# Patient Record
Sex: Female | Born: 1990 | Race: White | Hispanic: No | Marital: Married | Smoking: Never smoker
Health system: Southern US, Community
[De-identification: ages and names within clinical notes are randomized; demographics above are authoritative.]

## PROBLEM LIST (undated history)

## (undated) HISTORY — PX: TONSILLECTOMY: SUR1361

## (undated) HISTORY — PX: KNEE SURGERY: SHX244

---

## 2016-07-27 DIAGNOSIS — H66009 Acute suppurative otitis media without spontaneous rupture of ear drum, unspecified ear: Secondary | ICD-10-CM | POA: Diagnosis not present

## 2016-11-12 DIAGNOSIS — J0100 Acute maxillary sinusitis, unspecified: Secondary | ICD-10-CM | POA: Diagnosis not present

## 2017-03-31 DIAGNOSIS — Z01419 Encounter for gynecological examination (general) (routine) without abnormal findings: Secondary | ICD-10-CM | POA: Diagnosis not present

## 2017-05-17 DIAGNOSIS — Z1389 Encounter for screening for other disorder: Secondary | ICD-10-CM | POA: Diagnosis not present

## 2017-05-17 DIAGNOSIS — Z6828 Body mass index (BMI) 28.0-28.9, adult: Secondary | ICD-10-CM | POA: Diagnosis not present

## 2017-05-17 DIAGNOSIS — R42 Dizziness and giddiness: Secondary | ICD-10-CM | POA: Diagnosis not present

## 2017-05-17 DIAGNOSIS — Z789 Other specified health status: Secondary | ICD-10-CM | POA: Diagnosis not present

## 2017-08-30 DIAGNOSIS — N911 Secondary amenorrhea: Secondary | ICD-10-CM | POA: Diagnosis not present

## 2017-09-08 DIAGNOSIS — N911 Secondary amenorrhea: Secondary | ICD-10-CM | POA: Diagnosis not present

## 2017-09-15 DIAGNOSIS — Z3685 Encounter for antenatal screening for Streptococcus B: Secondary | ICD-10-CM | POA: Diagnosis not present

## 2017-09-15 DIAGNOSIS — Z3401 Encounter for supervision of normal first pregnancy, first trimester: Secondary | ICD-10-CM | POA: Diagnosis not present

## 2017-09-15 DIAGNOSIS — Z3480 Encounter for supervision of other normal pregnancy, unspecified trimester: Secondary | ICD-10-CM | POA: Diagnosis not present

## 2017-09-15 LAB — OB RESULTS CONSOLE ABO/RH: RH TYPE: POSITIVE

## 2017-09-15 LAB — OB RESULTS CONSOLE RUBELLA ANTIBODY, IGM: Rubella: IMMUNE

## 2017-09-15 LAB — OB RESULTS CONSOLE HEPATITIS B SURFACE ANTIGEN: Hepatitis B Surface Ag: NEGATIVE

## 2017-09-15 LAB — OB RESULTS CONSOLE ANTIBODY SCREEN: Antibody Screen: NEGATIVE

## 2017-09-15 LAB — OB RESULTS CONSOLE RPR: RPR: NONREACTIVE

## 2017-09-15 LAB — OB RESULTS CONSOLE HIV ANTIBODY (ROUTINE TESTING): HIV: NONREACTIVE

## 2017-10-06 DIAGNOSIS — Z3480 Encounter for supervision of other normal pregnancy, unspecified trimester: Secondary | ICD-10-CM | POA: Diagnosis not present

## 2017-10-06 DIAGNOSIS — Z113 Encounter for screening for infections with a predominantly sexual mode of transmission: Secondary | ICD-10-CM | POA: Diagnosis not present

## 2017-10-06 DIAGNOSIS — Z3A12 12 weeks gestation of pregnancy: Secondary | ICD-10-CM | POA: Diagnosis not present

## 2017-10-06 DIAGNOSIS — Z3400 Encounter for supervision of normal first pregnancy, unspecified trimester: Secondary | ICD-10-CM | POA: Diagnosis not present

## 2017-10-06 LAB — OB RESULTS CONSOLE GC/CHLAMYDIA: GC PROBE AMP, GENITAL: NEGATIVE

## 2017-10-13 DIAGNOSIS — Z3682 Encounter for antenatal screening for nuchal translucency: Secondary | ICD-10-CM | POA: Diagnosis not present

## 2017-10-13 DIAGNOSIS — Z13228 Encounter for screening for other metabolic disorders: Secondary | ICD-10-CM | POA: Diagnosis not present

## 2017-10-13 DIAGNOSIS — Z36 Encounter for antenatal screening for chromosomal anomalies: Secondary | ICD-10-CM | POA: Diagnosis not present

## 2017-10-13 DIAGNOSIS — Z3491 Encounter for supervision of normal pregnancy, unspecified, first trimester: Secondary | ICD-10-CM | POA: Diagnosis not present

## 2017-10-13 DIAGNOSIS — Z3480 Encounter for supervision of other normal pregnancy, unspecified trimester: Secondary | ICD-10-CM | POA: Diagnosis not present

## 2017-11-24 DIAGNOSIS — Z363 Encounter for antenatal screening for malformations: Secondary | ICD-10-CM | POA: Diagnosis not present

## 2017-12-22 DIAGNOSIS — Z362 Encounter for other antenatal screening follow-up: Secondary | ICD-10-CM | POA: Diagnosis not present

## 2017-12-22 DIAGNOSIS — Z3A22 22 weeks gestation of pregnancy: Secondary | ICD-10-CM | POA: Diagnosis not present

## 2017-12-27 DIAGNOSIS — J00 Acute nasopharyngitis [common cold]: Secondary | ICD-10-CM | POA: Diagnosis not present

## 2017-12-27 DIAGNOSIS — R0981 Nasal congestion: Secondary | ICD-10-CM | POA: Diagnosis not present

## 2018-01-17 DIAGNOSIS — J0100 Acute maxillary sinusitis, unspecified: Secondary | ICD-10-CM | POA: Diagnosis not present

## 2018-01-24 DIAGNOSIS — Z3480 Encounter for supervision of other normal pregnancy, unspecified trimester: Secondary | ICD-10-CM | POA: Diagnosis not present

## 2018-01-24 DIAGNOSIS — Z23 Encounter for immunization: Secondary | ICD-10-CM | POA: Diagnosis not present

## 2018-02-10 DIAGNOSIS — J101 Influenza due to other identified influenza virus with other respiratory manifestations: Secondary | ICD-10-CM | POA: Diagnosis not present

## 2018-02-10 DIAGNOSIS — Z1339 Encounter for screening examination for other mental health and behavioral disorders: Secondary | ICD-10-CM | POA: Diagnosis not present

## 2018-02-10 DIAGNOSIS — Z1331 Encounter for screening for depression: Secondary | ICD-10-CM | POA: Diagnosis not present

## 2018-02-10 DIAGNOSIS — R6889 Other general symptoms and signs: Secondary | ICD-10-CM | POA: Diagnosis not present

## 2018-03-22 DIAGNOSIS — Z3A35 35 weeks gestation of pregnancy: Secondary | ICD-10-CM | POA: Diagnosis not present

## 2018-03-22 DIAGNOSIS — Z3685 Encounter for antenatal screening for Streptococcus B: Secondary | ICD-10-CM | POA: Diagnosis not present

## 2018-03-22 DIAGNOSIS — Z3480 Encounter for supervision of other normal pregnancy, unspecified trimester: Secondary | ICD-10-CM | POA: Diagnosis not present

## 2018-04-01 LAB — OB RESULTS CONSOLE GBS: GBS: NEGATIVE

## 2018-04-20 ENCOUNTER — Inpatient Hospital Stay (HOSPITAL_COMMUNITY): Payer: BLUE CROSS/BLUE SHIELD | Admitting: Anesthesiology

## 2018-04-20 ENCOUNTER — Encounter (HOSPITAL_COMMUNITY): Payer: Self-pay | Admitting: *Deleted

## 2018-04-20 ENCOUNTER — Inpatient Hospital Stay (HOSPITAL_COMMUNITY)
Admission: AD | Admit: 2018-04-20 | Discharge: 2018-04-24 | DRG: 787 | Disposition: A | Discharge status: Home / Self Care | Payer: BLUE CROSS/BLUE SHIELD | Source: Ambulatory Visit | Attending: Obstetrics and Gynecology | Admitting: Obstetrics and Gynecology

## 2018-04-20 DIAGNOSIS — O324XX0 Maternal care for high head at term, not applicable or unspecified: Secondary | ICD-10-CM | POA: Diagnosis not present

## 2018-04-20 DIAGNOSIS — D62 Acute posthemorrhagic anemia: Secondary | ICD-10-CM | POA: Diagnosis not present

## 2018-04-20 DIAGNOSIS — Z3A00 Weeks of gestation of pregnancy not specified: Secondary | ICD-10-CM | POA: Diagnosis not present

## 2018-04-20 DIAGNOSIS — O9081 Anemia of the puerperium: Secondary | ICD-10-CM | POA: Diagnosis not present

## 2018-04-20 DIAGNOSIS — O4292 Full-term premature rupture of membranes, unspecified as to length of time between rupture and onset of labor: Secondary | ICD-10-CM | POA: Diagnosis present

## 2018-04-20 DIAGNOSIS — Z3A39 39 weeks gestation of pregnancy: Secondary | ICD-10-CM | POA: Diagnosis not present

## 2018-04-20 LAB — CBC
HCT: 35.3 % — ABNORMAL LOW (ref 36.0–46.0)
Hemoglobin: 12.1 g/dL (ref 12.0–15.0)
MCH: 29.6 pg (ref 26.0–34.0)
MCHC: 34.3 g/dL (ref 30.0–36.0)
MCV: 86.3 fL (ref 78.0–100.0)
Platelets: 282 10*3/uL (ref 150–400)
RBC: 4.09 MIL/uL (ref 3.87–5.11)
RDW: 12.9 % (ref 11.5–15.5)
WBC: 11.5 10*3/uL — AB (ref 4.0–10.5)

## 2018-04-20 LAB — ABO/RH: ABO/RH(D): O POS

## 2018-04-20 LAB — TYPE AND SCREEN
ABO/RH(D): O POS
ANTIBODY SCREEN: NEGATIVE

## 2018-04-20 LAB — POCT FERN TEST: POCT Fern Test: POSITIVE

## 2018-04-20 LAB — RPR: RPR: NONREACTIVE

## 2018-04-20 MED ORDER — EPHEDRINE 5 MG/ML INJ: 10.0000 mg | INTRAVENOUS | Status: DC | PRN

## 2018-04-20 MED ORDER — LACTATED RINGERS IV SOLN: 500.0000 mL | INTRAVENOUS | Status: DC | PRN

## 2018-04-20 MED ORDER — LACTATED RINGERS IV SOLN
500.0000 mL | Freq: Once | INTRAVENOUS | Status: AC
  Administered 2018-04-20: 500 mL via INTRAVENOUS

## 2018-04-20 MED ORDER — FLEET ENEMA 7-19 GM/118ML RE ENEM: 1.0000 | ENEMA | RECTAL | Status: DC | PRN

## 2018-04-20 MED ORDER — LACTATED RINGERS IV SOLN
INTRAVENOUS | Status: DC
  Administered 2018-04-20 (×2): via INTRAVENOUS

## 2018-04-20 MED ORDER — TERBUTALINE SULFATE 1 MG/ML IJ SOLN: 0.2500 mg | Freq: Once | INTRAMUSCULAR | Status: DC | PRN

## 2018-04-20 MED ORDER — OXYTOCIN 40 UNITS IN LACTATED RINGERS INFUSION - SIMPLE MED
1.0000 m[IU]/min | INTRAVENOUS | Status: DC
  Administered 2018-04-20: 2 m[IU]/min via INTRAVENOUS
  Filled 2018-04-20: qty 1000

## 2018-04-20 MED ORDER — OXYCODONE-ACETAMINOPHEN 5-325 MG PO TABS: 1.0000 | ORAL_TABLET | ORAL | Status: DC | PRN

## 2018-04-20 MED ORDER — FENTANYL 2.5 MCG/ML BUPIVACAINE 1/10 % EPIDURAL INFUSION (WH - ANES): 14.0000 mL/h | INTRAMUSCULAR | Status: DC | PRN

## 2018-04-20 MED ORDER — OXYTOCIN 40 UNITS IN LACTATED RINGERS INFUSION - SIMPLE MED: 2.5000 [IU]/h | INTRAVENOUS | Status: DC

## 2018-04-20 MED ORDER — LIDOCAINE HCL (PF) 1 % IJ SOLN
INTRAMUSCULAR | Status: DC | PRN
  Administered 2018-04-20 (×2): 4 mL via EPIDURAL

## 2018-04-20 MED ORDER — PHENYLEPHRINE 40 MCG/ML (10ML) SYRINGE FOR IV PUSH (FOR BLOOD PRESSURE SUPPORT)
80.0000 ug | PREFILLED_SYRINGE | INTRAVENOUS | Status: DC | PRN
  Filled 2018-04-20: qty 10

## 2018-04-20 MED ORDER — BUTORPHANOL TARTRATE 1 MG/ML IJ SOLN
1.0000 mg | Freq: Once | INTRAMUSCULAR | Status: AC
  Administered 2018-04-20: 1 mg via INTRAVENOUS
  Filled 2018-04-20: qty 1

## 2018-04-20 MED ORDER — OXYCODONE-ACETAMINOPHEN 5-325 MG PO TABS: 2.0000 | ORAL_TABLET | ORAL | Status: DC | PRN

## 2018-04-20 MED ORDER — OXYTOCIN BOLUS FROM INFUSION: 500.0000 mL | Freq: Once | INTRAVENOUS | Status: DC

## 2018-04-20 MED ORDER — FENTANYL 2.5 MCG/ML BUPIVACAINE 1/10 % EPIDURAL INFUSION (WH - ANES)
14.0000 mL/h | INTRAMUSCULAR | Status: DC | PRN
  Administered 2018-04-20 (×2): 14 mL/h via EPIDURAL
  Filled 2018-04-20 (×2): qty 100

## 2018-04-20 MED ORDER — LIDOCAINE HCL (PF) 1 % IJ SOLN
30.0000 mL | INTRAMUSCULAR | Status: DC | PRN
  Filled 2018-04-20: qty 30

## 2018-04-20 MED ORDER — SOD CITRATE-CITRIC ACID 500-334 MG/5ML PO SOLN
30.0000 mL | ORAL | Status: DC | PRN
  Administered 2018-04-20 – 2018-04-21 (×2): 30 mL via ORAL
  Filled 2018-04-20 (×2): qty 15

## 2018-04-20 MED ORDER — DIPHENHYDRAMINE HCL 50 MG/ML IJ SOLN: 12.5000 mg | INTRAMUSCULAR | Status: DC | PRN

## 2018-04-20 MED ORDER — PHENYLEPHRINE 40 MCG/ML (10ML) SYRINGE FOR IV PUSH (FOR BLOOD PRESSURE SUPPORT): 80.0000 ug | PREFILLED_SYRINGE | INTRAVENOUS | Status: DC | PRN

## 2018-04-20 MED ORDER — ACETAMINOPHEN 325 MG PO TABS: 650.0000 mg | ORAL_TABLET | ORAL | Status: DC | PRN

## 2018-04-20 MED ORDER — ONDANSETRON HCL 4 MG/2ML IJ SOLN: 4.0000 mg | Freq: Four times a day (QID) | INTRAMUSCULAR | Status: DC | PRN

## 2018-04-20 NOTE — Anesthesia Procedure Notes (Signed)
Epidural Patient location during procedure: OB Start time: 04/20/2018 1:00 PM End time: 04/20/2018 1:08 PM  Staffing Anesthesiologist: Lewie Loron, MD Performed: anesthesiologist   Preanesthetic Checklist Completed: patient identified, pre-op evaluation, timeout performed, IV checked, risks and benefits discussed and monitors and equipment checked  Epidural Patient position: sitting Prep: site prepped and draped and DuraPrep Patient monitoring: heart rate, continuous pulse ox and blood pressure Approach: midline Location: L3-L4 Injection technique: LOR air and LOR saline  Needle:  Needle type: Tuohy  Needle gauge: 17 G Needle length: 9 cm Needle insertion depth: 7 cm Catheter type: closed end flexible Catheter size: 19 Gauge Catheter at skin depth: 12 cm Test dose: negative  Assessment Sensory level: T8 Events: blood not aspirated, injection not painful, no injection resistance, negative IV test and no paresthesia  Additional Notes Reason for block:procedure for pain

## 2018-04-20 NOTE — Anesthesia Preprocedure Evaluation (Addendum)
Anesthesia Evaluation  Patient identified by MRN, date of birth, ID band Patient awake    Reviewed: Allergy & Precautions, NPO status , Patient's Chart, lab work & pertinent test results  Airway Mallampati: II  TM Distance: >3 FB Neck ROM: Full    Dental no notable dental hx.    Pulmonary neg pulmonary ROS,    Pulmonary exam normal breath sounds clear to auscultation       Cardiovascular negative cardio ROS Normal cardiovascular exam Rhythm:Regular Rate:Normal     Neuro/Psych negative neurological ROS  negative psych ROS   GI/Hepatic negative GI ROS, Neg liver ROS,   Endo/Other  negative endocrine ROS  Renal/GU negative Renal ROS     Musculoskeletal negative musculoskeletal ROS (+)   Abdominal   Peds  Hematology negative hematology ROS (+)   Anesthesia Other Findings   Reproductive/Obstetrics (+) Pregnancy                             Anesthesia Physical Anesthesia Plan  ASA: II and emergent  Anesthesia Plan: Epidural   Post-op Pain Management:    Induction: Intravenous  PONV Risk Score and Plan: Ondansetron, Dexamethasone, Treatment may vary due to age or medical condition and Scopolamine patch - Pre-op  Airway Management Planned: Natural Airway  Additional Equipment:   Intra-op Plan:   Post-operative Plan:   Informed Consent: I have reviewed the patients History and Physical, chart, labs and discussed the procedure including the risks, benefits and alternatives for the proposed anesthesia with the patient or authorized representative who has indicated his/her understanding and acceptance.   Dental advisory given  Plan Discussed with: CRNA, Anesthesiologist and Surgeon  Anesthesia Plan Comments: (C/Section for arrest of descent. Will use epidural. M. Malen Gauze, MD)       Anesthesia Quick Evaluation

## 2018-04-20 NOTE — H&P (Signed)
Meghan Mills is a 27 y.o. female presenting for SROM. OB History    Gravida  1   Para      Term      Preterm      AB      Living        SAB      TAB      Ectopic      Multiple      Live Births             History reviewed. No pertinent past medical history. History reviewed. No pertinent surgical history. Family History: family history is not on file. Social History:  reports that she has never smoked. She has never used smokeless tobacco. Her alcohol and drug histories are not on file.     Maternal Diabetes: No Genetic Screening: Normal Maternal Ultrasounds/Referrals: Normal Fetal Ultrasounds or other Referrals:  None Maternal Substance Abuse:  No Significant Maternal Medications:  None Significant Maternal Lab Results:  None Other Comments:  None  ROS Maternal Medical History:  Reason for admission: Rupture of membranes.   Contractions: Perceived severity is mild.    Fetal activity: Perceived fetal activity is normal.      Dilation: 2.5 Effacement (%): 70 Station: -1 Exam by:: Lauren Fields rn  Blood pressure 136/86, pulse 68, height  (1.753 m), weight 220 lb (99.8 kg). Exam Physical Exam  Constitutional: She appears well-developed and well-nourished.  HENT:  Head: Normocephalic and atraumatic.  Neck: Normal range of motion. Neck supple.  Cardiovascular: Normal rate and regular rhythm.  Respiratory: Effort normal and breath sounds normal.  GI:  Term FH, FHR 142  Genitourinary:  Genitourinary Comments: 2/50/vtx/clr af  Musculoskeletal: Normal range of motion.  Neurological: She is alert.    Prenatal labs: ABO, Rh:   Antibody:   Rubella:   RPR:    HBsAg:    HIV:    GBS:     Assessment/Plan: Term IUP/lSROM w/ early labor, GBS NEG   Meriel Pica 04/20/2018, 7:54 AM

## 2018-04-20 NOTE — Anesthesia Pain Management Evaluation Note (Signed)
  CRNA Pain Management Visit Note  Patient: Meghan Mills, 27 y.o., female  "Hello I am a member of the anesthesia team at [facility]. We have an anesthesia team available at all times to provide care throughout the hospital, including epidural management and anesthesia for C-section. I don't know your plan for the delivery whether it a natural birth, water birth, IV sedation, nitrous supplementation, doula or epidural, but we want to meet your pain goals."   1.Was your pain managed to your expectations on prior hospitalizations?   Yes   2.What is your expectation for pain management during this hospitalization?     Epidural  3.How can we help you reach that goal? Epidural as needed  Record the patient's initial score and the patient's pain goal.   Pain: 2  Pain Goal: 6 [facility] wants you to be able to say your pain was always managed very well.  Cleda Clarks 04/20/2018

## 2018-04-20 NOTE — MAU Note (Signed)
Pt presents to MAU c/o SROM at 0300. +FM. No bleeding.

## 2018-04-21 ENCOUNTER — Encounter (HOSPITAL_COMMUNITY): Payer: Self-pay

## 2018-04-21 ENCOUNTER — Encounter (HOSPITAL_COMMUNITY)
Admission: AD | Disposition: A | Discharge status: Home / Self Care | Payer: Self-pay | Source: Ambulatory Visit | Attending: Obstetrics and Gynecology

## 2018-04-21 LAB — GLUCOSE, CAPILLARY: Glucose-Capillary: 100 mg/dL — ABNORMAL HIGH (ref 65–99)

## 2018-04-21 SURGERY — Surgical Case
Anesthesia: Epidural

## 2018-04-21 MED ORDER — ONDANSETRON HCL 4 MG/2ML IJ SOLN
INTRAMUSCULAR | Status: AC
  Filled 2018-04-21: qty 4

## 2018-04-21 MED ORDER — OXYTOCIN 10 UNIT/ML IJ SOLN
INTRAMUSCULAR | Status: AC
  Filled 2018-04-21: qty 4

## 2018-04-21 MED ORDER — KETOROLAC TROMETHAMINE 30 MG/ML IJ SOLN
30.0000 mg | Freq: Four times a day (QID) | INTRAMUSCULAR | Status: AC | PRN
  Administered 2018-04-21: 30 mg via INTRAVENOUS
  Filled 2018-04-21: qty 1

## 2018-04-21 MED ORDER — PRENATAL MULTIVITAMIN CH
1.0000 | ORAL_TABLET | Freq: Every day | ORAL | Status: DC
  Administered 2018-04-21 – 2018-04-24 (×4): 1 via ORAL
  Filled 2018-04-21 (×4): qty 1

## 2018-04-21 MED ORDER — LACTATED RINGERS IV SOLN
INTRAVENOUS | Status: DC | PRN
  Administered 2018-04-21: 01:00:00 via INTRAVENOUS

## 2018-04-21 MED ORDER — COCONUT OIL OIL: 1.0000 "application " | TOPICAL_OIL | Status: DC | PRN

## 2018-04-21 MED ORDER — OXYCODONE-ACETAMINOPHEN 5-325 MG PO TABS
1.0000 | ORAL_TABLET | ORAL | Status: DC | PRN
  Administered 2018-04-22 (×2): 1 via ORAL
  Filled 2018-04-21 (×2): qty 1

## 2018-04-21 MED ORDER — DEXTROSE IN LACTATED RINGERS 5 % IV SOLN
INTRAVENOUS | Status: DC
  Administered 2018-04-21: 14:00:00 via INTRAVENOUS

## 2018-04-21 MED ORDER — ACETAMINOPHEN 325 MG PO TABS
650.0000 mg | ORAL_TABLET | ORAL | Status: DC | PRN
  Administered 2018-04-22 – 2018-04-24 (×5): 650 mg via ORAL
  Filled 2018-04-21 (×5): qty 2

## 2018-04-21 MED ORDER — NALBUPHINE HCL 10 MG/ML IJ SOLN: 5.0000 mg | INTRAMUSCULAR | Status: DC | PRN

## 2018-04-21 MED ORDER — NALOXONE HCL 0.4 MG/ML IJ SOLN: 0.4000 mg | INTRAMUSCULAR | Status: DC | PRN

## 2018-04-21 MED ORDER — KETOROLAC TROMETHAMINE 30 MG/ML IJ SOLN: 30.0000 mg | Freq: Four times a day (QID) | INTRAMUSCULAR | Status: AC | PRN

## 2018-04-21 MED ORDER — LIDOCAINE-EPINEPHRINE (PF) 2 %-1:200000 IJ SOLN
INTRAMUSCULAR | Status: AC
  Filled 2018-04-21: qty 20

## 2018-04-21 MED ORDER — SIMETHICONE 80 MG PO CHEW
80.0000 mg | CHEWABLE_TABLET | ORAL | Status: DC
  Administered 2018-04-22 – 2018-04-23 (×3): 80 mg via ORAL
  Filled 2018-04-21 (×2): qty 1

## 2018-04-21 MED ORDER — NALBUPHINE HCL 10 MG/ML IJ SOLN: 5.0000 mg | Freq: Once | INTRAMUSCULAR | Status: DC | PRN

## 2018-04-21 MED ORDER — DIPHENHYDRAMINE HCL 25 MG PO CAPS
25.0000 mg | ORAL_CAPSULE | Freq: Four times a day (QID) | ORAL | Status: DC | PRN
  Administered 2018-04-21: 25 mg via ORAL
  Filled 2018-04-21: qty 1

## 2018-04-21 MED ORDER — DIBUCAINE 1 % RE OINT: 1.0000 "application " | TOPICAL_OINTMENT | RECTAL | Status: DC | PRN

## 2018-04-21 MED ORDER — FLEET ENEMA 7-19 GM/118ML RE ENEM: 1.0000 | ENEMA | Freq: Every day | RECTAL | Status: DC | PRN

## 2018-04-21 MED ORDER — MENTHOL 3 MG MT LOZG: 1.0000 | LOZENGE | OROMUCOSAL | Status: DC | PRN

## 2018-04-21 MED ORDER — DEXAMETHASONE SODIUM PHOSPHATE 4 MG/ML IJ SOLN
INTRAMUSCULAR | Status: DC | PRN
  Administered 2018-04-21: 4 mg via INTRAVENOUS

## 2018-04-21 MED ORDER — NALOXONE HCL 4 MG/10ML IJ SOLN
1.0000 ug/kg/h | INTRAVENOUS | Status: DC | PRN
  Filled 2018-04-21: qty 5

## 2018-04-21 MED ORDER — ZOLPIDEM TARTRATE 5 MG PO TABS: 5.0000 mg | ORAL_TABLET | Freq: Every evening | ORAL | Status: DC | PRN

## 2018-04-21 MED ORDER — WITCH HAZEL-GLYCERIN EX PADS: 1.0000 "application " | MEDICATED_PAD | CUTANEOUS | Status: DC | PRN

## 2018-04-21 MED ORDER — FENTANYL CITRATE (PF) 100 MCG/2ML IJ SOLN: 25.0000 ug | INTRAMUSCULAR | Status: DC | PRN

## 2018-04-21 MED ORDER — OXYCODONE-ACETAMINOPHEN 5-325 MG PO TABS: 2.0000 | ORAL_TABLET | ORAL | Status: DC | PRN

## 2018-04-21 MED ORDER — DIPHENHYDRAMINE HCL 25 MG PO CAPS: 25.0000 mg | ORAL_CAPSULE | ORAL | Status: DC | PRN

## 2018-04-21 MED ORDER — SIMETHICONE 80 MG PO CHEW: 80.0000 mg | CHEWABLE_TABLET | ORAL | Status: DC | PRN

## 2018-04-21 MED ORDER — BISACODYL 10 MG RE SUPP: 10.0000 mg | Freq: Every day | RECTAL | Status: DC | PRN

## 2018-04-21 MED ORDER — SODIUM CHLORIDE 0.9% FLUSH: 3.0000 mL | Freq: Two times a day (BID) | INTRAVENOUS | Status: DC

## 2018-04-21 MED ORDER — SENNOSIDES-DOCUSATE SODIUM 8.6-50 MG PO TABS
2.0000 | ORAL_TABLET | ORAL | Status: DC
  Administered 2018-04-21 – 2018-04-23 (×3): 2 via ORAL
  Filled 2018-04-21 (×3): qty 2

## 2018-04-21 MED ORDER — NALBUPHINE HCL 10 MG/ML IJ SOLN
5.0000 mg | Freq: Once | INTRAMUSCULAR | Status: DC | PRN
Start: 2018-04-21 — End: 2018-04-24
  Filled 2018-04-21: qty 1

## 2018-04-21 MED ORDER — ONDANSETRON HCL 4 MG/2ML IJ SOLN: 4.0000 mg | Freq: Three times a day (TID) | INTRAMUSCULAR | Status: DC | PRN

## 2018-04-21 MED ORDER — MEASLES, MUMPS & RUBELLA VAC ~~LOC~~ INJ
0.5000 mL | INJECTION | Freq: Once | SUBCUTANEOUS | Status: DC
  Filled 2018-04-21: qty 0.5

## 2018-04-21 MED ORDER — SCOPOLAMINE 1 MG/3DAYS TD PT72
MEDICATED_PATCH | TRANSDERMAL | Status: DC | PRN
  Administered 2018-04-21: 1 via TRANSDERMAL

## 2018-04-21 MED ORDER — MORPHINE SULFATE (PF) 0.5 MG/ML IJ SOLN
INTRAMUSCULAR | Status: DC | PRN
  Administered 2018-04-21: 4 mg via EPIDURAL

## 2018-04-21 MED ORDER — MORPHINE SULFATE (PF) 0.5 MG/ML IJ SOLN
INTRAMUSCULAR | Status: AC
  Filled 2018-04-21: qty 10

## 2018-04-21 MED ORDER — SODIUM CHLORIDE 0.9% FLUSH: 3.0000 mL | INTRAVENOUS | Status: DC | PRN

## 2018-04-21 MED ORDER — SODIUM BICARBONATE 8.4 % IV SOLN
INTRAVENOUS | Status: AC
  Filled 2018-04-21: qty 50

## 2018-04-21 MED ORDER — CEFOTETAN DISODIUM-DEXTROSE 2-2.08 GM-%(50ML) IV SOLR
2.0000 g | Freq: Two times a day (BID) | INTRAVENOUS | Status: DC
  Administered 2018-04-21: 2 g via INTRAVENOUS
  Filled 2018-04-21 (×5): qty 50

## 2018-04-21 MED ORDER — IBUPROFEN 800 MG PO TABS
800.0000 mg | ORAL_TABLET | Freq: Three times a day (TID) | ORAL | Status: DC | PRN
  Administered 2018-04-21 – 2018-04-22 (×3): 800 mg via ORAL
  Filled 2018-04-21 (×4): qty 1

## 2018-04-21 MED ORDER — ONDANSETRON HCL 4 MG/2ML IJ SOLN
INTRAMUSCULAR | Status: DC | PRN
Start: 2018-04-21 — End: 2018-04-21
  Administered 2018-04-21: 4 mg via INTRAVENOUS

## 2018-04-21 MED ORDER — SIMETHICONE 80 MG PO CHEW
80.0000 mg | CHEWABLE_TABLET | Freq: Three times a day (TID) | ORAL | Status: DC
  Administered 2018-04-21 – 2018-04-24 (×10): 80 mg via ORAL
  Filled 2018-04-21 (×10): qty 1

## 2018-04-21 MED ORDER — STERILE WATER FOR IRRIGATION IR SOLN
Status: DC | PRN
  Administered 2018-04-21: 1000 mL

## 2018-04-21 MED ORDER — OXYTOCIN 40 UNITS IN LACTATED RINGERS INFUSION - SIMPLE MED: 2.5000 [IU]/h | INTRAVENOUS | Status: AC

## 2018-04-21 MED ORDER — SODIUM CHLORIDE 0.9 % IR SOLN
Status: DC | PRN
  Administered 2018-04-21: 1000 mL

## 2018-04-21 MED ORDER — DIPHENHYDRAMINE HCL 50 MG/ML IJ SOLN: 12.5000 mg | INTRAMUSCULAR | Status: DC | PRN

## 2018-04-21 MED ORDER — LIDOCAINE-EPINEPHRINE (PF) 2 %-1:200000 IJ SOLN
INTRAMUSCULAR | Status: DC | PRN
  Administered 2018-04-21 (×4): 5 mL via EPIDURAL

## 2018-04-21 MED ORDER — MEPERIDINE HCL 25 MG/ML IJ SOLN: 6.2500 mg | INTRAMUSCULAR | Status: DC | PRN

## 2018-04-21 MED ORDER — TETANUS-DIPHTH-ACELL PERTUSSIS 5-2.5-18.5 LF-MCG/0.5 IM SUSP: 0.5000 mL | Freq: Once | INTRAMUSCULAR | Status: DC

## 2018-04-21 MED ORDER — NALBUPHINE HCL 10 MG/ML IJ SOLN
5.0000 mg | INTRAMUSCULAR | Status: DC | PRN
  Administered 2018-04-21 (×2): 5 mg via SUBCUTANEOUS
  Filled 2018-04-21: qty 1

## 2018-04-21 MED ORDER — CEFOTETAN DISODIUM-DEXTROSE 2-2.08 GM-%(50ML) IV SOLR: 2.0000 g | Freq: Two times a day (BID) | INTRAVENOUS | Status: DC

## 2018-04-21 MED ORDER — SODIUM CHLORIDE 0.9 % IV SOLN: 250.0000 mL | INTRAVENOUS | Status: DC

## 2018-04-21 SURGICAL SUPPLY — 29 items
BENZOIN TINCTURE PRP APPL 2/3 (GAUZE/BANDAGES/DRESSINGS) ×3 IMPLANT
CHLORAPREP W/TINT 26ML (MISCELLANEOUS) ×3 IMPLANT
CLAMP CORD UMBIL (MISCELLANEOUS) IMPLANT
CLOSURE STERI STRIP 1/2 X4 (GAUZE/BANDAGES/DRESSINGS) ×2 IMPLANT
CLOSURE WOUND 1/2 X4 (GAUZE/BANDAGES/DRESSINGS) ×1
CLOTH BEACON ORANGE TIMEOUT ST (SAFETY) ×3 IMPLANT
DRSG OPSITE POSTOP 4X10 (GAUZE/BANDAGES/DRESSINGS) ×3 IMPLANT
ELECT REM PT RETURN 9FT ADLT (ELECTROSURGICAL) ×3
ELECTRODE REM PT RTRN 9FT ADLT (ELECTROSURGICAL) ×1 IMPLANT
EXTRACTOR VACUUM M CUP 4 TUBE (SUCTIONS) IMPLANT
EXTRACTOR VACUUM M CUP 4' TUBE (SUCTIONS)
GLOVE BIO SURGEON STRL SZ7 (GLOVE) ×3 IMPLANT
GLOVE BIOGEL PI IND STRL 7.0 (GLOVE) ×2 IMPLANT
GLOVE BIOGEL PI INDICATOR 7.0 (GLOVE) ×4
GOWN STRL REUS W/TWL LRG LVL3 (GOWN DISPOSABLE) ×6 IMPLANT
KIT ABG SYR 3ML LUER SLIP (SYRINGE) IMPLANT
NEEDLE HYPO 25X5/8 SAFETYGLIDE (NEEDLE) ×3 IMPLANT
NS IRRIG 1000ML POUR BTL (IV SOLUTION) ×3 IMPLANT
PACK C SECTION WH (CUSTOM PROCEDURE TRAY) ×3 IMPLANT
PAD OB MATERNITY 4.3X12.25 (PERSONAL CARE ITEMS) ×3 IMPLANT
PENCIL SMOKE EVAC W/HOLSTER (ELECTROSURGICAL) ×3 IMPLANT
STRIP CLOSURE SKIN 1/2X4 (GAUZE/BANDAGES/DRESSINGS) ×2 IMPLANT
SUT CHROMIC 0 CTX 36 (SUTURE) ×9 IMPLANT
SUT MON AB 4-0 PS1 27 (SUTURE) ×3 IMPLANT
SUT PDS AB 0 CT1 27 (SUTURE) ×6 IMPLANT
SUT VIC AB 3-0 CT1 27 (SUTURE) ×4
SUT VIC AB 3-0 CT1 TAPERPNT 27 (SUTURE) ×2 IMPLANT
TOWEL OR 17X24 6PK STRL BLUE (TOWEL DISPOSABLE) ×3 IMPLANT
TRAY FOLEY W/BAG SLVR 14FR LF (SET/KITS/TRAYS/PACK) ×3 IMPLANT

## 2018-04-21 NOTE — Progress Notes (Signed)
C/C/+1 w/ incr caput after good pushing effort X 2.5 hrs>>>rec CS for CPD, proced + risks discussed

## 2018-04-21 NOTE — Addendum Note (Signed)
Addendum  created 04/21/18 1610 by Orlie Pollen, CRNA   Sign clinical note

## 2018-04-21 NOTE — Lactation Note (Signed)
This note was copied from a baby's chart. Lactation Consultation Note  Patient Name: Meghan Mills [id number] Date: 04/21/2018 Reason for consult: Initial assessment   P1, Baby 15 hours old.  Mother hand expressed good flow of colostrum. Gave baby 3 ml on spoon.  Mother's nipples evert and compressible. Assisted w/ latching in football hold.  Baby came off and on breast but is eager. Encouraged mother to compress breast during feeding. Discussed basics. Mom encouraged to feed baby 8-12 times/24 hours and with feeding cues.  Mom made aware of O/P services, breastfeeding support groups, community resources, and our phone # for post-discharge questions.     Maternal Data Has patient been taught Hand Expression?: Yes Does the patient have breastfeeding experience prior to this delivery?: No  Feeding Feeding Type: Breast Fed Length of feed: 10 min(off and on)  LATCH Score Latch: Grasps breast easily, tongue down, lips flanged, rhythmical sucking.  Audible Swallowing: A few with stimulation  Type of Nipple: Everted at rest and after stimulation  Comfort (Breast/Nipple): Soft / non-tender  Hold (Positioning): Assistance needed to correctly position infant at breast and maintain latch.  LATCH Score: 8  Interventions Interventions: Breast feeding basics reviewed;Assisted with latch;Skin to skin;Breast massage;Hand express;Breast compression;Adjust position;Support pillows;Position options  Lactation Tools Discussed/Used     Consult Status Consult Status: Follow-up Date: 04/22/18 Follow-up type: In-patient    Dahlia Byes [facility] 04/21/2018, 5:22 PM

## 2018-04-21 NOTE — Plan of Care (Signed)
Admission completed, patient did well since on postpartum

## 2018-04-21 NOTE — Anesthesia Postprocedure Evaluation (Signed)
Anesthesia Post Note  Patient: Gorgeous Newlun  Procedure(s) Performed: CESAREAN SECTION (N/A )     Patient location during evaluation: PACU Anesthesia Type: Epidural Level of consciousness: awake and alert and oriented Pain management: pain level controlled Vital Signs Assessment: post-procedure vital signs reviewed and stable Respiratory status: spontaneous breathing, nonlabored ventilation and respiratory function stable Cardiovascular status: blood pressure returned to baseline and stable Postop Assessment: no headache, no backache, spinal receding, patient able to bend at knees and no apparent nausea or vomiting Anesthetic complications: no    Last Vitals:  Vitals:   04/21/18 0315 04/21/18 0320  BP: 117/68   Pulse: 88 93  Resp: (!) 25 17  Temp:    SpO2: 98% 98%    Last Pain:  Vitals:   04/21/18 0315  TempSrc:   PainSc: 0-No pain   Pain Goal:                 Azaryah Oleksy A.

## 2018-04-21 NOTE — Progress Notes (Signed)
Subjective: Postpartum Day 0: Cesarean Delivery Patient reports tolerating PO.    Objective: Vital signs in last 24 hours: Temp:  [97.5 F (36.4 C)-99.8 F (37.7 C)] 99.6 F (37.6 C) (05/09 0645) Pulse Rate:  [61-95] 74 (05/09 0645) Resp:  [12-25] 18 (05/09 0645) BP: (86-160)/(50-94) 138/87 (05/09 0645) SpO2:  [96 %-100 %] 97 % (05/09 0645)  Physical Exam:  General: alert, cooperative and no distress Lochia: appropriate Uterine Fundus: firm Incision: healing well DVT Evaluation: No evidence of DVT seen on physical exam.  Recent Labs    04/20/18 0645  HGB 12.1  HCT 35.3*    Assessment/Plan: Status post Cesarean section. Doing well postoperatively.  Continue current care.  Roselle Locus II 04/21/2018, 8:19 AM

## 2018-04-21 NOTE — Op Note (Signed)
Preoperative diagnosis: Failure to descend  Postoperative diagnosis: Same  Procedure: Primary low transverse cesarean section  Surgeon: Marcelle Overlie  Anesthesia: Epidural  EBL: 700 cc  Procedure and findings: Patient taken to the operating room after an adequate level of epidural anesthesia was obtained with the patient in the supine position the abdomen prepped and draped the Foley catheter position and preop antibiotics were given.  Appropriate timeouts were taken at that point.  Prior to prepping the vertex was dislodged from the pelvis by the surgeon.  The procedure started transverse incision made 2 fingerbreadths above the symphysis carried down the fascia which was incised and extended transversely.  Rectus muscle divided in the midline, peritoneum entered superiorly without difficulty and extended in a vertical fashion.  The vesicouterine serosa was then incised and dissected below, bladder blade repositioned transverse incision made in the lower segment extended with blunt dissection moderate meconium was noted.  The vertex was transverse, asynclitic, easily delivered from the pelvis to affect delivery of vigorous infant Apgars 8 9, cord pH was sent, placenta was removed manually sent to pathology uterus exteriorized, cavity wiped clean with laparotomy pack closure obtained the first 0 chromic in a locked fashion followed by an imbricating layer of 0 chromic.  This was hemostatic bilateral tubes and ovaries were normal.  Prior to closure sponge, needle, instrument counts reported as correct x2 peritoneum closed with a 3-0 Vicryl running suture of the same on the rectus muscles in the midline 0 PDS to close the fascia transversely the subcutaneous tissue was irrigated noted to be hemostatic closed with a running 3-0 Vicryl suture 4-0 Monocryl subcuticular skin closure with Steri-Strips and a honeycomb dressing she tolerated this well went to recovery room in good condition.  Clear urine noted at the  end of the case.  Dictated with Dragon medical 1  Meriel Pica MD

## 2018-04-21 NOTE — Anesthesia Postprocedure Evaluation (Signed)
Anesthesia Post Note  Patient: Meghan Mills  Procedure(s) Performed: CESAREAN SECTION (N/A )     Patient location during evaluation: Mother Baby Anesthesia Type: Epidural Level of consciousness: awake and alert Pain management: pain level controlled Vital Signs Assessment: post-procedure vital signs reviewed and stable Respiratory status: spontaneous breathing, nonlabored ventilation and respiratory function stable Cardiovascular status: stable Postop Assessment: no headache, no backache and epidural receding Anesthetic complications: no    Last Vitals:  Vitals:   04/21/18 0520 04/21/18 0645  BP: 122/79 138/87  Pulse: 68 74  Resp: 19 18  Temp: 36.9 C 37.6 C  SpO2: 98% 97%    Last Pain:  Vitals:   04/21/18 0645  TempSrc: Oral  PainSc:    Pain Goal:                 [employer]

## 2018-04-21 NOTE — Lactation Note (Signed)
This note was copied from a baby's chart. Lactation Consultation Note  Patient Name: Meghan Mills RUEAV'W Date: 04/21/2018     Baby 14 hours old.  Mother and baby sleeping.  Room full of visitors. LC will return later.   Maternal Data    Feeding    LATCH Score                   Interventions    Lactation Tools Discussed/Used     Consult Status      Hardie Pulley 04/21/2018, 4:32 PM

## 2018-04-21 NOTE — Transfer of Care (Signed)
Immediate Anesthesia Transfer of Care Note  Patient: Meghan Mills  Procedure(s) Performed: CESAREAN SECTION (N/A )  Patient Location: PACU  Anesthesia Type:Epidural  Level of Consciousness: awake, alert  and oriented  Airway & Oxygen Therapy: Patient Spontanous Breathing  Post-op Assessment: Report given to RN and Post -op Vital signs reviewed and stable  Post vital signs: Reviewed and stable HR 88, RR 14, SaO2 100%, 100/74  Last Vitals:  Vitals Value Taken Time  BP    Temp    Pulse    Resp    SpO2      Last Pain:  Vitals:   04/21/18 0000  TempSrc: Oral  PainSc:          Complications: No apparent anesthesia complications

## 2018-04-22 ENCOUNTER — Encounter (HOSPITAL_COMMUNITY): Payer: Self-pay | Admitting: Obstetrics and Gynecology

## 2018-04-22 LAB — CBC
HCT: 27.9 % — ABNORMAL LOW (ref 36.0–46.0)
Hemoglobin: 9.2 g/dL — ABNORMAL LOW (ref 12.0–15.0)
MCH: 29.7 pg (ref 26.0–34.0)
MCHC: 33 g/dL (ref 30.0–36.0)
MCV: 90 fL (ref 78.0–100.0)
PLATELETS: 225 10*3/uL (ref 150–400)
RBC: 3.1 MIL/uL — ABNORMAL LOW (ref 3.87–5.11)
RDW: 13.5 % (ref 11.5–15.5)
WBC: 14.3 10*3/uL — ABNORMAL HIGH (ref 4.0–10.5)

## 2018-04-22 MED ORDER — FERROUS SULFATE 325 (65 FE) MG PO TABS
325.0000 mg | ORAL_TABLET | Freq: Two times a day (BID) | ORAL | Status: DC
  Administered 2018-04-22 – 2018-04-24 (×4): 325 mg via ORAL
  Filled 2018-04-22 (×4): qty 1

## 2018-04-22 MED ORDER — IBUPROFEN 600 MG PO TABS
600.0000 mg | ORAL_TABLET | Freq: Four times a day (QID) | ORAL | Status: DC
  Administered 2018-04-22 – 2018-04-24 (×7): 600 mg via ORAL
  Filled 2018-04-22 (×7): qty 1

## 2018-04-22 NOTE — Lactation Note (Signed)
This note was copied from a baby's chart. Lactation Consultation Note  Patient Name: Meghan Mills ZOXWR'U Date: 04/22/2018 Reason for consult: Follow-up assessment  Visited with P1 Mom, baby 27 hrs old.  Mom denies needing any assistance, baby had just breastfed for 35 mins and voided.  MB RN assisted and assess latch, latch score of 9 given.   Baby swaddled in FOB's arms.  Mom turned on her side planning to take a nap.  Encouraged baby to be STS when Mom awake, to encouraged frequent feedings.  Goal of >8 feedings per 24 hrs discussed.  Encouraged Mom to call for assistance as needed.   Consult Status Consult Status: Follow-up Date: 04/23/18 Follow-up type: In-patient    Meghan Mills 04/22/2018, 3:44 PM

## 2018-04-22 NOTE — Progress Notes (Signed)
Subjective: Postpartum Day 1: Cesarean Delivery Patient reports tolerating PO and no problems voiding.  Patient desires circ.  Objective: Vital signs in last 24 hours: Temp:  [97.9 F (36.6 C)-98.9 F (37.2 C)] 98.1 F (36.7 C) (05/10 0600) Pulse Rate:  [73-87] 73 (05/10 0600) Resp:  [16-18] 16 (05/10 0600) BP: (105-134)/(63-80) 116/72 (05/10 0600) SpO2:  [95 %-100 %] 96 % (05/10 0600)  Physical Exam:  General: alert, cooperative and appears stated age 27: appropriate Uterine Fundus: firm Incision: healing well, no significant drainage, no dehiscence DVT Evaluation: No evidence of DVT seen on physical exam. Negative Homan's sign. No cords or calf tenderness.  Recent Labs    04/20/18 0645 04/22/18 0531  HGB 12.1 9.2*  HCT 35.3* 27.9*    Assessment/Plan: Status post Cesarean section. Doing well postoperatively.  Continue current care. ABL anemia-FeSO4 started. Circ-patient counseled re: risk of bleeding, infection, and scarring.  All questions were answered.  Erasmo Vertz 04/22/2018, 8:54 AM

## 2018-04-22 NOTE — Lactation Note (Signed)
This note was copied from a baby's chart. Lactation Consultation Note  Patient Name: Boy Jermeka Schlotterbeck ZOXWR'U Date: 04/22/2018 Reason for consult: Follow-up assessment;Term;1st time breastfeeding;Primapara  RN called LC for lactation assistance. Per RN baby had a difficult latch and he kept coming off the breast. Mom had baby STS when entering the room; offered assistance with latch but she politely declined and voiced that baby already fed. Per mom feedings at the breast are comfortable; baby was able to latch on his last feeding and she could hear baby swallowing when he was at the breast. Asked mom to call again for latch assistance when needed. Discussed newborn sleeping cycle and cluster feeding. Both parents aware of LC services and will call PRN.  Maternal Data    Feeding Feeding Type: Breast Fed Length of feed: 15 min   Interventions Interventions: Breast feeding basics reviewed  Lactation Tools Discussed/Used     Consult Status Consult Status: Follow-up Date: 04/23/18 Follow-up type: In-patient    Correna Meacham Venetia Constable 04/22/2018, 4:16 AM

## 2018-04-23 NOTE — Lactation Note (Signed)
This note was copied from a baby's chart. Lactation Consultation Note:  Father came to the nurses desk to ask for assistance with breastfeeding. He reports that wife's nipples are very sore and she wants to stop breastfeeding.  Observed that the Rt nipple has a positional strip that is scabbed over.  No observed redness or trama noted on alternate nipple. Teaching on proper position and latch technique. Mother taught to hand express before and after feeding.  Mother assist to chair and infant placed in football hold with good support and alignment. Infant latched on quickly. Infant sustained latch with good rhythmic suckling and audible swallows for 20 mins. Mother denies pain with latch or feeding.  Mother nipple round when infant released the breast. Mother was taught breast compression.  Discussed cluster feeding and cue base feeding . Advised to continue to rotate positions frequently. Mother was given comfort gels. Mother receptive to all teaching, father at side for good support. Mother to ask for assistance as needed. Encouraged skin to skin.  Patient Name: Meghan Mills ZOXWR'U Date: 04/23/2018 Reason for consult: Follow-up assessment   Maternal Data    Feeding Feeding Type: Breast Fed Length of feed: 20 min  LATCH Score Latch: Grasps breast easily, tongue down, lips flanged, rhythmical sucking.  Audible Swallowing: Spontaneous and intermittent  Type of Nipple: Everted at rest and after stimulation  Comfort (Breast/Nipple): Filling, red/small blisters or bruises, mild/mod discomfort  Hold (Positioning): Assistance needed to correctly position infant at breast and maintain latch.  LATCH Score: 8  Interventions Interventions: Assisted with latch;Skin to skin;Breast massage;Hand express;Breast compression;Adjust position;Support pillows;Position options;Expressed milk;Comfort gels  Lactation Tools Discussed/Used     Consult Status Consult Status: Follow-up Date:  04/24/18 Follow-up type: In-patient    Stevan Born [facility] 04/23/2018, 9:43 AM

## 2018-04-23 NOTE — Progress Notes (Signed)
Subjective: Postpartum Day 2: Cesarean Delivery Patient reports tolerating PO and no problems voiding.    Objective: Vital signs in last 24 hours: Temp:  [97.8 F (36.6 C)] 97.8 F (36.6 C) (05/11 1610) Pulse Rate:  [66-92] 66 (05/11 0638) Resp:  [14-17] 14 (05/11 9604) BP: (119-121)/(72-76) 121/76 (05/11 5409) SpO2:  [98 %] 98 % (05/11 8119)  Physical Exam:  General: alert, cooperative and appears stated age Lochia: appropriate Uterine Fundus: firm Incision: healing well, no significant drainage, no dehiscence DVT Evaluation: No evidence of DVT seen on physical exam. Negative Homan's sign. No cords or calf tenderness.  Recent Labs    04/22/18 0531  HGB 9.2*  HCT 27.9*    Assessment/Plan: Status post Cesarean section. Doing well postoperatively.  Continue current care.  Meghan Mills 04/23/2018, 10:19 AM

## 2018-04-24 ENCOUNTER — Ambulatory Visit: Payer: Self-pay

## 2018-04-24 MED ORDER — IBUPROFEN 600 MG PO TABS: 600.0000 mg | ORAL_TABLET | Freq: Four times a day (QID) | ORAL | 0 refills | Status: DC

## 2018-04-24 MED ORDER — OXYCODONE-ACETAMINOPHEN 5-325 MG PO TABS: 1.0000 | ORAL_TABLET | ORAL | 0 refills | Status: DC | PRN

## 2018-04-24 NOTE — Discharge Summary (Signed)
Obstetric Discharge Summary Reason for Admission: rupture of membranes Prenatal Procedures: none Intrapartum Procedures: cesarean: low cervical, transverse Postpartum Procedures: none Complications-Operative and Postpartum: none Hemoglobin  Date Value Ref Range Status  04/22/2018 9.2 (L) 12.0 - 15.0 g/dL Final    Comment:    DELTA CHECK NOTED REPEATED TO VERIFY    HCT  Date Value Ref Range Status  04/22/2018 27.9 (L) 36.0 - 46.0 % Final    Physical Exam:  General: alert, cooperative and appears stated age 27: appropriate Uterine Fundus: firm Incision: healing well, no significant drainage, no dehiscence DVT Evaluation: No evidence of DVT seen on physical exam. Negative Homan's sign. No cords or calf tenderness.  Discharge Diagnoses: Term Pregnancy-delivered  Discharge Information: Date: 04/24/2018 Activity: pelvic rest Diet: routine Medications: PNV, Ibuprofen and Percocet Condition: stable Instructions: refer to practice specific booklet Discharge to: home   Newborn Data: Live born female  Birth Weight: 8 lb 13.3 oz (4006 g) APGAR: 8, 9  Newborn Delivery   Birth date/time:  [birth year]/2018 01:34:00 Delivery type:  C-Section, Low Transverse Trial of labor:  No C-section categorization:  Primary     Home with mother.  Meghan Mills 04/24/2018, 10:34 AM

## 2018-04-24 NOTE — Lactation Note (Signed)
This note was copied from a baby's chart. Lactation Consultation Note:  Mother request latch assistance. Mother sitting in chair. Assist mother with cross cradle hold. Infant sustained latch for 20 mins. Mother denies having discomfort with latch. Mother encouraged to do breast compression and continue to hand express. Assist mother with latching infant on in football hold on alternate breast. Lots of teaching with parents. Mother becomes discouraged when infant crys and refuses the breast. Advised to feed infant with early feeding cues. Feed infant at least 8-12 times in 24 hours. Mother advised to do frequent skin to skin. Discussed treatment and prevention of engorgement. Mother receptive to all teaching.   Patient Name: Meghan Mills [id number] Date: 04/24/2018     Maternal Data    Feeding Feeding Type: Breast Fed  LATCH Score                   Interventions    Lactation Tools Discussed/Used     Consult Status      Michel Bickers 04/24/2018, 2:55 PM

## 2018-04-24 NOTE — Discharge Instructions (Signed)
Call MD for T>100.4, heavy vaginal bleeding, severe abdominal pain, intractable nausea and/or vomiting, or respiratory distress.  Call office to schedule postop incision check in 1 weeks.  Pelvic rest x 6 weeks.  No driving while taking narcotics.   °

## 2018-05-26 DIAGNOSIS — Z1389 Encounter for screening for other disorder: Secondary | ICD-10-CM | POA: Diagnosis not present

## 2018-05-31 ENCOUNTER — Encounter (HOSPITAL_COMMUNITY)
Admission: EM | Disposition: A | Discharge status: Home / Self Care | Payer: Self-pay | Source: Home / Self Care | Attending: Emergency Medicine

## 2018-05-31 ENCOUNTER — Emergency Department (HOSPITAL_COMMUNITY): Payer: BLUE CROSS/BLUE SHIELD | Admitting: Registered Nurse

## 2018-05-31 ENCOUNTER — Other Ambulatory Visit: Payer: Self-pay

## 2018-05-31 ENCOUNTER — Emergency Department (HOSPITAL_COMMUNITY): Payer: BLUE CROSS/BLUE SHIELD

## 2018-05-31 ENCOUNTER — Observation Stay (HOSPITAL_COMMUNITY)
Admission: EM | Admit: 2018-05-31 | Discharge: 2018-06-02 | Disposition: A | Discharge status: Home / Self Care | Payer: BLUE CROSS/BLUE SHIELD | Attending: General Surgery | Admitting: General Surgery

## 2018-05-31 ENCOUNTER — Encounter (HOSPITAL_COMMUNITY): Payer: Self-pay | Admitting: Emergency Medicine

## 2018-05-31 DIAGNOSIS — R1011 Right upper quadrant pain: Secondary | ICD-10-CM | POA: Diagnosis not present

## 2018-05-31 DIAGNOSIS — K8000 Calculus of gallbladder with acute cholecystitis without obstruction: Secondary | ICD-10-CM | POA: Diagnosis not present

## 2018-05-31 DIAGNOSIS — O904 Postpartum acute kidney failure: Secondary | ICD-10-CM | POA: Insufficient documentation

## 2018-05-31 DIAGNOSIS — K8020 Calculus of gallbladder without cholecystitis without obstruction: Secondary | ICD-10-CM | POA: Diagnosis not present

## 2018-05-31 DIAGNOSIS — K8012 Calculus of gallbladder with acute and chronic cholecystitis without obstruction: Secondary | ICD-10-CM | POA: Insufficient documentation

## 2018-05-31 DIAGNOSIS — R945 Abnormal results of liver function studies: Secondary | ICD-10-CM | POA: Diagnosis not present

## 2018-05-31 DIAGNOSIS — K8050 Calculus of bile duct without cholangitis or cholecystitis without obstruction: Secondary | ICD-10-CM | POA: Diagnosis not present

## 2018-05-31 DIAGNOSIS — Z419 Encounter for procedure for purposes other than remedying health state, unspecified: Secondary | ICD-10-CM

## 2018-05-31 DIAGNOSIS — R1013 Epigastric pain: Secondary | ICD-10-CM | POA: Diagnosis not present

## 2018-05-31 DIAGNOSIS — K8010 Calculus of gallbladder with chronic cholecystitis without obstruction: Secondary | ICD-10-CM | POA: Diagnosis not present

## 2018-05-31 DIAGNOSIS — O9963 Diseases of the digestive system complicating the puerperium: Principal | ICD-10-CM | POA: Insufficient documentation

## 2018-05-31 DIAGNOSIS — O9089 Other complications of the puerperium, not elsewhere classified: Secondary | ICD-10-CM | POA: Diagnosis not present

## 2018-05-31 DIAGNOSIS — Z888 Allergy status to other drugs, medicaments and biological substances status: Secondary | ICD-10-CM | POA: Diagnosis not present

## 2018-05-31 DIAGNOSIS — K810 Acute cholecystitis: Secondary | ICD-10-CM | POA: Diagnosis present

## 2018-05-31 DIAGNOSIS — R933 Abnormal findings on diagnostic imaging of other parts of digestive tract: Secondary | ICD-10-CM

## 2018-05-31 DIAGNOSIS — R110 Nausea: Secondary | ICD-10-CM | POA: Diagnosis not present

## 2018-05-31 DIAGNOSIS — K219 Gastro-esophageal reflux disease without esophagitis: Secondary | ICD-10-CM | POA: Diagnosis not present

## 2018-05-31 HISTORY — PX: CHOLECYSTECTOMY: SHX55

## 2018-05-31 LAB — URINALYSIS, ROUTINE W REFLEX MICROSCOPIC
BACTERIA UA: NONE SEEN
Bilirubin Urine: NEGATIVE
Glucose, UA: NEGATIVE mg/dL
Hgb urine dipstick: NEGATIVE
KETONES UR: NEGATIVE mg/dL
Nitrite: NEGATIVE
PROTEIN: NEGATIVE mg/dL
Specific Gravity, Urine: 1.024 (ref 1.005–1.030)
pH: 6 (ref 5.0–8.0)

## 2018-05-31 LAB — CBC
HEMATOCRIT: 37.2 % (ref 36.0–46.0)
Hemoglobin: 12 g/dL (ref 12.0–15.0)
MCH: 28.2 pg (ref 26.0–34.0)
MCHC: 32.3 g/dL (ref 30.0–36.0)
MCV: 87.5 fL (ref 78.0–100.0)
Platelets: 332 10*3/uL (ref 150–400)
RBC: 4.25 MIL/uL (ref 3.87–5.11)
RDW: 13.4 % (ref 11.5–15.5)
WBC: 12 10*3/uL — AB (ref 4.0–10.5)

## 2018-05-31 LAB — I-STAT BETA HCG BLOOD, ED (MC, WL, AP ONLY): I-stat hCG, quantitative: <5 m[IU]/mL (ref <5)

## 2018-05-31 LAB — COMPREHENSIVE METABOLIC PANEL
ALK PHOS: 466 U/L — AB (ref 38–126)
ALT: 68 U/L — AB (ref 14–54)
AST: 107 U/L — ABNORMAL HIGH (ref 15–41)
Albumin: 4.2 g/dL (ref 3.5–5.0)
Anion gap: 9 (ref 5–15)
BUN: 18 mg/dL (ref 6–20)
CHLORIDE: 109 mmol/L (ref 101–111)
CO2: 27 mmol/L (ref 22–32)
Calcium: 9.1 mg/dL (ref 8.9–10.3)
Creatinine, Ser: 1.5 mg/dL — ABNORMAL HIGH (ref 0.44–1.00)
GFR calc Af Amer: 54 mL/min — ABNORMAL LOW (ref >60)
GFR calc non Af Amer: 47 mL/min — ABNORMAL LOW (ref >60)
GLUCOSE: 120 mg/dL — AB (ref 65–99)
POTASSIUM: 3.7 mmol/L (ref 3.5–5.1)
SODIUM: 145 mmol/L (ref 135–145)
Total Bilirubin: 0.6 mg/dL (ref 0.3–1.2)
Total Protein: 7.3 g/dL (ref 6.5–8.1)

## 2018-05-31 LAB — LIPASE, BLOOD: Lipase: 40 U/L (ref 11–51)

## 2018-05-31 SURGERY — LAPAROSCOPIC CHOLECYSTECTOMY WITH INTRAOPERATIVE CHOLANGIOGRAM
Anesthesia: General

## 2018-05-31 MED ORDER — SODIUM CHLORIDE 0.9 % IV BOLUS
1000.0000 mL | Freq: Once | INTRAVENOUS | Status: AC
  Administered 2018-05-31: 1000 mL via INTRAVENOUS

## 2018-05-31 MED ORDER — SUGAMMADEX SODIUM 200 MG/2ML IV SOLN
INTRAVENOUS | Status: AC
  Filled 2018-05-31: qty 2

## 2018-05-31 MED ORDER — LIDOCAINE 2% (20 MG/ML) 5 ML SYRINGE
INTRAMUSCULAR | Status: AC
  Filled 2018-05-31: qty 5

## 2018-05-31 MED ORDER — ACETAMINOPHEN 500 MG PO TABS
1000.0000 mg | ORAL_TABLET | Freq: Three times a day (TID) | ORAL | Status: DC
  Administered 2018-05-31 – 2018-06-01 (×4): 1000 mg via ORAL
  Filled 2018-05-31 (×5): qty 2

## 2018-05-31 MED ORDER — MIDAZOLAM HCL 2 MG/2ML IJ SOLN
INTRAMUSCULAR | Status: AC
  Filled 2018-05-31: qty 2

## 2018-05-31 MED ORDER — POLYETHYLENE GLYCOL 3350 17 G PO PACK: 17.0000 g | PACK | Freq: Every day | ORAL | Status: DC | PRN

## 2018-05-31 MED ORDER — LIDOCAINE 2% (20 MG/ML) 5 ML SYRINGE
INTRAMUSCULAR | Status: DC | PRN
  Administered 2018-05-31: 80 mg via INTRAVENOUS

## 2018-05-31 MED ORDER — PROPOFOL 10 MG/ML IV BOLUS
INTRAVENOUS | Status: AC
  Filled 2018-05-31: qty 20

## 2018-05-31 MED ORDER — FENTANYL CITRATE (PF) 100 MCG/2ML IJ SOLN
INTRAMUSCULAR | Status: AC
  Filled 2018-05-31: qty 2

## 2018-05-31 MED ORDER — HYDROMORPHONE HCL 1 MG/ML IJ SOLN: 0.2500 mg | INTRAMUSCULAR | Status: DC | PRN

## 2018-05-31 MED ORDER — DEXAMETHASONE SODIUM PHOSPHATE 10 MG/ML IJ SOLN
INTRAMUSCULAR | Status: AC
  Filled 2018-05-31: qty 1

## 2018-05-31 MED ORDER — FENTANYL CITRATE (PF) 100 MCG/2ML IJ SOLN
INTRAMUSCULAR | Status: DC | PRN
  Administered 2018-05-31 (×7): 50 ug via INTRAVENOUS

## 2018-05-31 MED ORDER — MIDAZOLAM HCL 5 MG/5ML IJ SOLN
INTRAMUSCULAR | Status: DC | PRN
  Administered 2018-05-31: 2 mg via INTRAVENOUS

## 2018-05-31 MED ORDER — GLYCOPYRROLATE 0.2 MG/ML IV SOSY
PREFILLED_SYRINGE | INTRAVENOUS | Status: AC
  Filled 2018-05-31: qty 5

## 2018-05-31 MED ORDER — LACTATED RINGERS IV SOLN
INTRAVENOUS | Status: DC
  Administered 2018-05-31 – 2018-06-01 (×2): via INTRAVENOUS
  Administered 2018-06-02: 1000 mL via INTRAVENOUS
  Administered 2018-06-02: 09:00:00 via INTRAVENOUS

## 2018-05-31 MED ORDER — ROCURONIUM BROMIDE 100 MG/10ML IV SOLN
INTRAVENOUS | Status: AC
  Filled 2018-05-31: qty 1

## 2018-05-31 MED ORDER — ONDANSETRON HCL 4 MG/2ML IJ SOLN
INTRAMUSCULAR | Status: AC
  Filled 2018-05-31: qty 2

## 2018-05-31 MED ORDER — EPHEDRINE 5 MG/ML INJ
INTRAVENOUS | Status: AC
  Filled 2018-05-31: qty 10

## 2018-05-31 MED ORDER — IBUPROFEN 200 MG PO TABS
600.0000 mg | ORAL_TABLET | Freq: Four times a day (QID) | ORAL | Status: DC | PRN
  Administered 2018-06-01: 600 mg via ORAL
  Filled 2018-05-31: qty 3

## 2018-05-31 MED ORDER — FENTANYL CITRATE (PF) 100 MCG/2ML IJ SOLN
50.0000 ug | Freq: Once | INTRAMUSCULAR | Status: AC
  Administered 2018-05-31: 50 ug via INTRAVENOUS
  Filled 2018-05-31: qty 2

## 2018-05-31 MED ORDER — GLUCAGON HCL RDNA (DIAGNOSTIC) 1 MG IJ SOLR
INTRAMUSCULAR | Status: DC | PRN
  Administered 2018-05-31: .5 mg via INTRAVENOUS

## 2018-05-31 MED ORDER — LIDOCAINE HCL (PF) 1 % IJ SOLN
INTRAMUSCULAR | Status: AC
  Filled 2018-05-31: qty 30

## 2018-05-31 MED ORDER — FAMOTIDINE IN NACL 20-0.9 MG/50ML-% IV SOLN
20.0000 mg | Freq: Once | INTRAVENOUS | Status: AC
  Administered 2018-05-31: 20 mg via INTRAVENOUS
  Filled 2018-05-31: qty 50

## 2018-05-31 MED ORDER — FENTANYL CITRATE (PF) 250 MCG/5ML IJ SOLN
INTRAMUSCULAR | Status: AC
  Filled 2018-05-31: qty 5

## 2018-05-31 MED ORDER — BUPIVACAINE-EPINEPHRINE (PF) 0.25% -1:200000 IJ SOLN
INTRAMUSCULAR | Status: AC
  Filled 2018-05-31: qty 30

## 2018-05-31 MED ORDER — ROCURONIUM BROMIDE 10 MG/ML (PF) SYRINGE
PREFILLED_SYRINGE | INTRAVENOUS | Status: DC | PRN
  Administered 2018-05-31: 40 mg via INTRAVENOUS

## 2018-05-31 MED ORDER — 0.9 % SODIUM CHLORIDE (POUR BTL) OPTIME
TOPICAL | Status: DC | PRN
  Administered 2018-05-31: 1000 mL

## 2018-05-31 MED ORDER — ONDANSETRON HCL 4 MG/2ML IJ SOLN: 4.0000 mg | Freq: Four times a day (QID) | INTRAMUSCULAR | Status: DC | PRN

## 2018-05-31 MED ORDER — IOPAMIDOL (ISOVUE-300) INJECTION 61%
INTRAVENOUS | Status: DC | PRN
  Administered 2018-05-31: 8 mL

## 2018-05-31 MED ORDER — SUGAMMADEX SODIUM 200 MG/2ML IV SOLN
INTRAVENOUS | Status: DC | PRN
  Administered 2018-05-31: 180 mg via INTRAVENOUS

## 2018-05-31 MED ORDER — LIDOCAINE HCL (PF) 1 % IJ SOLN
INTRAMUSCULAR | Status: DC | PRN
  Administered 2018-05-31: 10 mL

## 2018-05-31 MED ORDER — MORPHINE SULFATE (PF) 4 MG/ML IV SOLN
4.0000 mg | Freq: Once | INTRAVENOUS | Status: AC
  Administered 2018-05-31: 4 mg via INTRAVENOUS
  Filled 2018-05-31: qty 1

## 2018-05-31 MED ORDER — DIPHENHYDRAMINE HCL 25 MG PO CAPS: 25.0000 mg | ORAL_CAPSULE | Freq: Four times a day (QID) | ORAL | Status: DC | PRN

## 2018-05-31 MED ORDER — IOPAMIDOL (ISOVUE-300) INJECTION 61%
INTRAVENOUS | Status: AC
  Filled 2018-05-31: qty 50

## 2018-05-31 MED ORDER — DEXAMETHASONE SODIUM PHOSPHATE 10 MG/ML IJ SOLN
INTRAMUSCULAR | Status: DC | PRN
  Administered 2018-05-31: 10 mg via INTRAVENOUS

## 2018-05-31 MED ORDER — ONDANSETRON HCL 4 MG/2ML IJ SOLN
INTRAMUSCULAR | Status: DC | PRN
  Administered 2018-05-31: 4 mg via INTRAVENOUS

## 2018-05-31 MED ORDER — PROPOFOL 10 MG/ML IV BOLUS
INTRAVENOUS | Status: DC | PRN
  Administered 2018-05-31: 150 mg via INTRAVENOUS

## 2018-05-31 MED ORDER — OXYCODONE HCL 5 MG PO TABS
5.0000 mg | ORAL_TABLET | ORAL | Status: DC | PRN
  Administered 2018-06-01 (×2): 5 mg via ORAL
  Filled 2018-05-31 (×2): qty 1

## 2018-05-31 MED ORDER — GLYCOPYRROLATE PF 0.2 MG/ML IJ SOSY
PREFILLED_SYRINGE | INTRAMUSCULAR | Status: DC | PRN
  Administered 2018-05-31: .2 mg via INTRAVENOUS

## 2018-05-31 MED ORDER — SODIUM CHLORIDE 0.9 % IV SOLN
2.0000 g | INTRAVENOUS | Status: AC
  Administered 2018-06-01: 2 g via INTRAVENOUS
  Filled 2018-05-31: qty 20

## 2018-05-31 MED ORDER — GI COCKTAIL ~~LOC~~
30.0000 mL | Freq: Once | ORAL | Status: AC
  Administered 2018-05-31: 30 mL via ORAL
  Filled 2018-05-31: qty 30

## 2018-05-31 MED ORDER — GLUCAGON HCL RDNA (DIAGNOSTIC) 1 MG IJ SOLR
INTRAMUSCULAR | Status: AC
  Filled 2018-05-31: qty 1

## 2018-05-31 MED ORDER — LACTATED RINGERS IR SOLN
Status: DC | PRN
  Administered 2018-05-31: 1000 mL

## 2018-05-31 MED ORDER — SODIUM CHLORIDE 0.9 % IV SOLN
2.0000 g | INTRAVENOUS | Status: AC
  Administered 2018-05-31: 2 g via INTRAVENOUS
  Filled 2018-05-31: qty 20

## 2018-05-31 MED ORDER — KETOROLAC TROMETHAMINE 30 MG/ML IJ SOLN
INTRAMUSCULAR | Status: DC | PRN
  Administered 2018-05-31: 30 mg via INTRAVENOUS

## 2018-05-31 MED ORDER — BUPIVACAINE-EPINEPHRINE (PF) 0.25% -1:200000 IJ SOLN
INTRAMUSCULAR | Status: DC | PRN
  Administered 2018-05-31: 10 mL

## 2018-05-31 MED ORDER — EPHEDRINE SULFATE-NACL 50-0.9 MG/10ML-% IV SOSY
PREFILLED_SYRINGE | INTRAVENOUS | Status: DC | PRN
  Administered 2018-05-31 (×2): 5 mg via INTRAVENOUS

## 2018-05-31 MED ORDER — ONDANSETRON 4 MG PO TBDP: 4.0000 mg | ORAL_TABLET | Freq: Four times a day (QID) | ORAL | Status: DC | PRN

## 2018-05-31 MED ORDER — DIPHENHYDRAMINE HCL 50 MG/ML IJ SOLN: 25.0000 mg | Freq: Four times a day (QID) | INTRAMUSCULAR | Status: DC | PRN

## 2018-05-31 MED ORDER — ONDANSETRON HCL 4 MG/2ML IJ SOLN
4.0000 mg | Freq: Once | INTRAMUSCULAR | Status: AC
  Administered 2018-05-31: 4 mg via INTRAVENOUS
  Filled 2018-05-31: qty 2

## 2018-05-31 SURGICAL SUPPLY — 46 items
APPLIER CLIP ROT 10 11.4 M/L (STAPLE) ×3
CABLE HIGH FREQUENCY MONO STRZ (ELECTRODE) ×3 IMPLANT
CHLORAPREP W/TINT 26ML (MISCELLANEOUS) ×3 IMPLANT
CLIP APPLIE ROT 10 11.4 M/L (STAPLE) ×1 IMPLANT
CLIP VESOLOCK MED LG 6/CT (CLIP) IMPLANT
COVER MAYO STAND STRL (DRAPES) ×3 IMPLANT
COVER SURGICAL LIGHT HANDLE (MISCELLANEOUS) ×3 IMPLANT
DECANTER SPIKE VIAL GLASS SM (MISCELLANEOUS) ×3 IMPLANT
DERMABOND ADVANCED (GAUZE/BANDAGES/DRESSINGS) ×2
DERMABOND ADVANCED .7 DNX12 (GAUZE/BANDAGES/DRESSINGS) ×1 IMPLANT
DRAPE C-ARM 42X120 X-RAY (DRAPES) ×3 IMPLANT
DRAPE LAPAROSCOPIC ABDOMINAL (DRAPES) ×3 IMPLANT
ELECT PENCIL ROCKER SW 15FT (MISCELLANEOUS) ×3 IMPLANT
ELECT REM PT RETURN 15FT ADLT (MISCELLANEOUS) ×3 IMPLANT
ENDOLOOP SUT PDS II  0 18 (SUTURE) ×2
ENDOLOOP SUT PDS II 0 18 (SUTURE) ×1 IMPLANT
GLOVE BIO SURGEON STRL SZ 6 (GLOVE) ×3 IMPLANT
GLOVE BIO SURGEON STRL SZ7.5 (GLOVE) ×3 IMPLANT
GLOVE BIOGEL PI IND STRL 6.5 (GLOVE) ×1 IMPLANT
GLOVE BIOGEL PI IND STRL 7.0 (GLOVE) ×3 IMPLANT
GLOVE BIOGEL PI INDICATOR 6.5 (GLOVE) ×2
GLOVE BIOGEL PI INDICATOR 7.0 (GLOVE) ×6
GLOVE ECLIPSE 7.0 STRL STRAW (GLOVE) ×3 IMPLANT
GLOVE INDICATOR 6.5 STRL GRN (GLOVE) ×3 IMPLANT
GOWN STRL REUS W/TWL 2XL LVL3 (GOWN DISPOSABLE) ×3 IMPLANT
GOWN STRL REUS W/TWL XL LVL3 (GOWN DISPOSABLE) ×6 IMPLANT
HEMOSTAT SNOW SURGICEL 2X4 (HEMOSTASIS) IMPLANT
KIT BASIN OR (CUSTOM PROCEDURE TRAY) ×3 IMPLANT
L-HOOK LAP DISP 36CM (ELECTROSURGICAL) ×3
LHOOK LAP DISP 36CM (ELECTROSURGICAL) ×1 IMPLANT
POSITIONER SURGICAL ARM (MISCELLANEOUS) IMPLANT
POUCH RETRIEVAL ECOSAC 10 (ENDOMECHANICALS) ×1 IMPLANT
POUCH RETRIEVAL ECOSAC 10MM (ENDOMECHANICALS) ×2
POUCH SPECIMEN RETRIEVAL 10MM (ENDOMECHANICALS) ×3 IMPLANT
SCISSORS LAP 5X35 DISP (ENDOMECHANICALS) ×3 IMPLANT
SET CHOLANGIOGRAPH MIX (MISCELLANEOUS) ×3 IMPLANT
SET IRRIG TUBING LAPAROSCOPIC (IRRIGATION / IRRIGATOR) ×3 IMPLANT
SLEEVE XCEL OPT CAN 5 100 (ENDOMECHANICALS) ×3 IMPLANT
SUT MNCRL AB 4-0 PS2 18 (SUTURE) ×6 IMPLANT
TOWEL OR 17X26 10 PK STRL BLUE (TOWEL DISPOSABLE) ×3 IMPLANT
TOWEL OR NON WOVEN STRL DISP B (DISPOSABLE) ×3 IMPLANT
TRAY LAPAROSCOPIC (CUSTOM PROCEDURE TRAY) ×3 IMPLANT
TROCAR BLADELESS OPT 5 100 (ENDOMECHANICALS) ×3 IMPLANT
TROCAR XCEL BLUNT TIP 100MML (ENDOMECHANICALS) ×3 IMPLANT
TROCAR XCEL NON-BLD 11X100MML (ENDOMECHANICALS) ×3 IMPLANT
TUBING INSUF HEATED (TUBING) ×3 IMPLANT

## 2018-05-31 NOTE — ED Triage Notes (Signed)
Pt reports having epigastric pain that started appx 2 hours ago. Pt reports pain with movement and occasional sharp pain.

## 2018-05-31 NOTE — Transfer of Care (Signed)
Immediate Anesthesia Transfer of Care Note  Patient: Meghan Mills  Procedure(s) Performed: LAPAROSCOPIC CHOLECYSTECTOMY WITH INTRAOPERATIVE CHOLANGIOGRAM (N/A )  Patient Location: PACU  Anesthesia Type:General  Level of Consciousness: awake, alert , oriented and patient cooperative  Airway & Oxygen Therapy: Patient Spontanous Breathing and Patient connected to face mask oxygen  Post-op Assessment: Report given to RN, Post -op Vital signs reviewed and stable and Patient moving all extremities  Post vital signs: Reviewed and stable  Last Vitals:  Vitals Value Taken Time  BP 132/88 05/31/2018 12:32 PM  Temp    Pulse 66 05/31/2018 12:35 PM  Resp 8 05/31/2018 12:35 PM  SpO2 100 % 05/31/2018 12:35 PM  Vitals shown include unvalidated device data.  Last Pain:  Vitals:   05/31/18 0837  TempSrc:   PainSc: 8       Patients Stated Pain Goal: 6 (05/31/18 0538)  Complications: No apparent anesthesia complications

## 2018-05-31 NOTE — Anesthesia Postprocedure Evaluation (Signed)
Anesthesia Post Note  Patient: Meghan CaffeyJessica Mills  Procedure(s) Performed: LAPAROSCOPIC CHOLECYSTECTOMY WITH INTRAOPERATIVE CHOLANGIOGRAM (N/A )     Patient location during evaluation: PACU Anesthesia Type: General Level of consciousness: awake Pain management: pain level controlled Vital Signs Assessment: post-procedure vital signs reviewed and stable Respiratory status: spontaneous breathing Cardiovascular status: stable Anesthetic complications: no    Last Vitals:  Vitals:   05/31/18 1320 05/31/18 1418  BP: 125/85 117/81  Pulse: (!) 50 (!) 49  Resp: 14 16  Temp: 36.8 C 36.7 C  SpO2: 98% 99%    Last Pain:  Vitals:   05/31/18 1418  TempSrc: Oral  PainSc:                  Titianna Loomis

## 2018-05-31 NOTE — ED Notes (Signed)
Pt. Transported to OR.

## 2018-05-31 NOTE — Anesthesia Preprocedure Evaluation (Addendum)
Anesthesia Evaluation  Patient identified by MRN, date of birth, ID band Patient awake    Reviewed: Allergy & Precautions, NPO status , Patient's Chart, lab work & pertinent test results  Airway Mallampati: II  TM Distance: >3 FB     Dental   Pulmonary neg pulmonary ROS,    breath sounds clear to auscultation       Cardiovascular negative cardio ROS   Rhythm:Regular Rate:Normal     Neuro/Psych    GI/Hepatic negative GI ROS, Neg liver ROS,   Endo/Other  negative endocrine ROS  Renal/GU negative Renal ROS     Musculoskeletal   Abdominal   Peds  Hematology   Anesthesia Other Findings   Reproductive/Obstetrics                             Anesthesia Physical Anesthesia Plan  ASA: II  Anesthesia Plan: General   Post-op Pain Management:    Induction: Intravenous  PONV Risk Score and Plan: Ondansetron, Dexamethasone and Midazolam  Airway Management Planned: Oral ETT  Additional Equipment:   Intra-op Plan:   Post-operative Plan: Extubation in OR  Informed Consent: I have reviewed the patients History and Physical, chart, labs and discussed the procedure including the risks, benefits and alternatives for the proposed anesthesia with the patient or authorized representative who has indicated his/her understanding and acceptance.   Dental advisory given  Plan Discussed with: Anesthesiologist and CRNA  Anesthesia Plan Comments:         Anesthesia Quick Evaluation

## 2018-05-31 NOTE — Consult Note (Signed)
[facility] Consult  Referring Provider: Almond LintByerly, Faera, MD Primary Care Physician:  [facility] Primary Gastroenterologist: Gentry FitzUnassigned  Reason for Consultation:  CBD dilation, possible CBD stones  HPI: Meghan Mills is a 27 y.o. female recent laparoscopic cholecystectomy today, intraoperative cholangiogram showed dilated common bile duct and slow clearance of contrast into the duodenum, possible CBD stone. Patient is 6 weeks postpartum and currently breast-feeding. She denies any medical history, does not take any medications at home. No history of alcohol use and smoking. She was seen and examined in PACU immediately post cholecystectomy. She had presented to the ER with acute onset of epigastric abdominal pain radiating to the back without fever, nausea or vomiting.This was a second episode of biliary colic in the last 4 weeks.  History reviewed. No pertinent past medical history.  Past Surgical History:  Procedure Laterality Date  . CESAREAN SECTION N/A 04/21/2018   Procedure: CESAREAN SECTION;  Surgeon: Richarda OverlieHolland, Richard, MD;  [facility];  Service: Obstetrics;  Laterality: N/A;    Prior to Admission medications   Medication Sig Start Date End Date Taking? Authorizing Provider  hydrocortisone cream 1 % Apply 1 application topically 2 (two) times daily as needed for itching.   Yes [provider]  Prenatal Vit-Fe Fumarate-FA (PRENATAL MULTIVITAMIN) TABS tablet Take 1 tablet by mouth daily at 12 noon.   Yes [provider]  ibuprofen (ADVIL,MOTRIN) 600 MG tablet Take 1 tablet (600 mg total) by mouth 4 (four) times daily. Patient not taking: Reported on 05/31/2018 04/24/18   Mitchel HonourMorris, Megan, DO  oxyCODONE-acetaminophen (PERCOCET/ROXICET) 5-325 MG tablet Take 1 tablet by mouth every 4 (four) hours as needed (pain scale 4-7). Patient not taking: Reported on 05/31/2018 04/24/18   Mitchel HonourMorris, Megan, DO    Current Facility-Administered  Medications  Medication Dose Route Frequency Provider Last Rate Last Dose  . HYDROmorphone (DILAUDID) injection 0.25-0.5 mg  0.25-0.5 mg Intravenous Q5 min PRN Dorris SinghGreen, Charlene, MD      . lactated ringers infusion   Intravenous Continuous Adam PhenixSimaan, Elizabeth S, PA-C        Allergies as of 05/31/2018 - Review Complete 05/31/2018  Allergen Reaction Noted  . Other Hives 04/20/2018    History reviewed. No pertinent family history.  Social History   Socioeconomic History  . Marital status: Married    Spouse name: Not on file  . Number of children: Not on file  . Years of education: Not on file  . Highest education level: Not on file  Occupational History  . Not on file  Social Needs  . Financial resource strain: Not on file  . Food insecurity:    Worry: Not on file    Inability: Not on file  . Transportation needs:    Medical: Not on file    Non-medical: Not on file  Tobacco Use  . Smoking status: Never Smoker  . Smokeless tobacco: Never Used  Substance and Sexual Activity  . Alcohol use: Not on file  . Drug use: Not on file  . Sexual activity: Not on file  Lifestyle  . Physical activity:    Days per week: Not on file    Minutes per session: Not on file  . Stress: Not on file  Relationships  . Social connections:    Talks on phone: Not on file    Gets together: Not on file    Attends religious service: Not on file    Active member of club or organization: Not on file  Attends meetings of clubs or organizations: Not on file    Relationship status: Not on file  . Intimate partner violence:    Fear of current or ex partner: Not on file    Emotionally abused: Not on file    Physically abused: Not on file    Forced sexual activity: Not on file  Other Topics Concern  . Not on file  Social History Narrative  . Not on file    Review of Systems: Positive for: GI: Described in detail in HPI.    ZOX:WRUEAV,  Denies any fever, rigors, night sweats, anorexia, fatigue,  weakness, malaise, involuntary weight loss, and sleep disorder CV: Denies chest pain, angina, palpitations, syncope, orthopnea, PND, peripheral edema, and claudication. Resp: Denies dyspnea, cough, sputum, wheezing, coughing up blood. GU : Denies urinary burning, blood in urine, urinary frequency, urinary hesitancy, nocturnal urination, and urinary incontinence. MS: Denies joint pain or swelling.  Denies muscle weakness, cramps, atrophy.  Derm: Denies rash, itching, oral ulcerations, hives, unhealing ulcers.  Psych: Denies depression, anxiety, memory loss, suicidal ideation, hallucinations,  and confusion. Heme: Denies bruising, bleeding, and enlarged lymph nodes. Neuro:  Denies any headaches, dizziness, paresthesias. Endo:  Denies any problems with DM, thyroid, adrenal function.  Physical Exam: Vital signs in last 24 hours: Temp:  [97.5 F (36.4 C)-98.8 F (37.1 C)] 98.8 F (37.1 C) (06/18 1232) Pulse Rate:  [51-68] 57 (06/18 1245) Resp:  [12-18] 12 (06/18 1245) BP: (114-132)/(71-88) 131/79 (06/18 1245) SpO2:  [99 %-100 %] 100 % (06/18 1245) Weight:  [87.5 kg (193 lb)] 87.5 kg (193 lb) (06/18 0423)    General:   Alert,  Well-developed, well-nourished, pleasant and cooperative in NAD Head:  Normocephalic and atraumatic. Eyes:  Sclera clear, no icterus.   Conjunctiva pink. Ears:  Normal auditory acuity. Nose:  No deformity, discharge,  or lesions. Mouth:  No deformity or lesions.  Oropharynx pink & moist. Neck:  Supple; no masses or thyromegaly. Lungs:  Clear throughout to auscultation.   No wheezes, crackles, or rhonchi. No acute distress. Heart:  Regular rate and rhythm; no murmurs, clicks, rubs,  or gallops. Extremities:  Without clubbing or edema. Neurologic:  Alert and  oriented x4;  grossly normal neurologically. Skin:  Intact without significant lesions or rashes. Psych:  Alert and cooperative. Normal mood and affect. Abdomen:  Small laparoscopic incisions, mild generalized  tenderness, bowel sounds not audible     Lab Results: Recent Labs    05/31/18 0517  WBC 12.0*  HGB 12.0  HCT 37.2  PLT 332   BMET Recent Labs    05/31/18 0517  NA 145  K 3.7  CL 109  CO2 27  GLUCOSE 120*  BUN 18  CREATININE 1.50*  CALCIUM 9.1   LFT Recent Labs    05/31/18 0517  PROT 7.3  ALBUMIN 4.2  AST 107*  ALT 68*  ALKPHOS 466*  BILITOT 0.6   PT/INR No results for input(s): LABPROT, INR in the last 72 hours.  Studies/Results: Dg Cholangiogram Operative  Result Date: 05/31/2018 CLINICAL DATA:  Intraoperative cholangiogram during laparoscopic cholecystectomy. EXAM: INTRAOPERATIVE CHOLANGIOGRAM FLUOROSCOPY TIME:  50 seconds (12.6 mGy) COMPARISON:  Right upper quadrant abdominal ultrasound-05/31/2018 FINDINGS: Intraoperative cholangiographic images of the right upper abdominal quadrant during laparoscopic cholecystectomy are provided for review. Surgical clips overlie the expected location of the gallbladder fossa. Contrast injection demonstrates selective cannulation of the central aspect of the cystic duct. There is passage of contrast through the central aspect of the cystic duct  with filling of a non dilated common bile duct. There is passage of contrast though the CBD and into the descending portion of the duodenum. There is minimal reflux of injected contrast into the common hepatic duct and central aspect of the non dilated intrahepatic biliary system. There are no discrete filling defects within the opacified portions of the biliary system to suggest the presence of choledocholithiasis. IMPRESSION: No evidence of choledocholithiasis. Electronically Signed   By: Simonne Come M.D.   On: 05/31/2018 12:31   US Abdomen Limited Ruq  Result Date: 05/31/2018 CLINICAL DATA:  Right upper quadrant pain EXAM: ULTRASOUND ABDOMEN LIMITED RIGHT UPPER QUADRANT COMPARISON:  None. FINDINGS: Gallbladder: Well distended with gallstones within. No wall thickening or pericholecystic  fluid is noted. Common bile duct: Diameter: 8.2 mm. This is significantly increased for the patient's age in raises suspicion for distal common bile duct stone. Correlation with laboratory values is recommended. Liver: No focal lesion identified. Within normal limits in parenchymal echogenicity. Portal vein is patent on color Doppler imaging with normal direction of blood flow towards the liver. IMPRESSION: Cholelithiasis. Prominent common bile duct suspicious for distal common bile duct stone. No intrahepatic ductal dilatation is seen. Correlation with laboratory values is recommended. Electronically Signed   By: Alcide Clever M.D.   On: 05/31/2018 07:44    Impression: 1.Acute calculus cholecystitis, laparoscopic cholecystectomy with intraoperative cholangiogram 2.CBD was 8.2 mm on ultrasound   Plan: She is scheduled for MRCP today. Mild leukocytosis, WBC 12,normal T bili 0.6, elevated ALP 466, elevated AST 107, elevated ALT 68. If MRCP shows CBD stone, will plan ERCP. The risks and benefits of the procedure were discussed with the patient in details. She understands and verbalizes consent.    LOS: 0 days   Kerin Salen, MD  05/31/2018, 1:16 PM  Pager [phone] If no answer or after 5 PM call [phone]

## 2018-05-31 NOTE — ED Provider Notes (Addendum)
  Physical Exam  BP 116/75   Pulse (!) 51   Temp (!) 97.5 F (36.4 C) (Oral)   Resp 18   Ht 5\' 9"  (1.753 m)   Wt 87.5 kg (193 lb)   SpO2 99%   BMI 28.50 kg/m   Physical Exam  ED Course/Procedures     Procedures  MDM   Assuming care of patient from Dr. Manus Gunningancour   Patient in the ED for abd pain. Pain is different than her GERD and she is 6 weeks post partum, c-section. Workup thus far shows elevated LFTs with mostly epigastric and RUQ abd tenderness.  Important pending results are RUQ US.  According to Dr. Manus Gunningancour, plan is to f/u on US and reassess.   Patient had no complains, no concerns from the nursing side. Will continue to monitor.  8:44 AM Surgery has been consulted. They will take the patient to the OR.    Derwood KaplanNanavati, Keaten Mashek, MD 05/31/18 [id number]    Derwood KaplanNanavati, Madex Seals, MD 05/31/18 [phone]

## 2018-05-31 NOTE — ED Notes (Signed)
Pt to remain NPO at this time.

## 2018-05-31 NOTE — Anesthesia Procedure Notes (Signed)
Procedure Name: Intubation Date/Time: 05/31/2018 10:54 AM Performed by: Victoriano Lain, CRNA Pre-anesthesia Checklist: Patient identified, Emergency Drugs available, Suction available, Patient being monitored and Timeout performed Patient Re-evaluated:Patient Re-evaluated prior to induction Oxygen Delivery Method: Circle system utilized Preoxygenation: Pre-oxygenation with 100% oxygen Induction Type: IV induction Ventilation: Mask ventilation without difficulty Laryngoscope Size: Mac and 4 Grade View: Grade I Tube type: Oral Tube size: 7.5 mm Number of attempts: 1 Airway Equipment and Method: Stylet Placement Confirmation: ETT inserted through vocal cords under direct vision,  positive ETCO2 and breath sounds checked- equal and bilateral Secured at: 21 cm Tube secured with: Tape Dental Injury: Teeth and Oropharynx as per pre-operative assessment

## 2018-05-31 NOTE — Op Note (Signed)
Laparoscopic Cholecystectomy with IOC Procedure Note  Indications: This patient presents with acute calculous cholecystitis with suspicion for common duct stone.    Pre-operative Diagnosis: acute calculous cholecystitis  Post-operative Diagnosis: Same  Surgeon: Almond LintBYERLY,Deshawn Skelley   Assistants: Will Marlyne BeardsJennings, PA-C  Anesthesia: General endotracheal anesthesia and local  ASA Class: 2  Procedure Details  The patient was seen again in the Holding Room. The risks, benefits, complications, treatment options, and expected outcomes were discussed with the patient. The possibilities of  bleeding, recurrent infection, damage to nearby structures, the need for additional procedures, failure to diagnose a condition, the possible need to convert to an open procedure, and creating a complication requiring transfusion or operation were discussed with the patient. The likelihood of improving the patient's symptoms with return to their baseline status is good.    The patient and/or family concurred with the proposed plan, giving informed consent. The site of surgery properly noted. The patient was taken to Operating Room, and the procedure verified as Laparoscopic Cholecystectomy with Intraoperative Cholangiogram. A Time Out was held and the above information confirmed.  Prior to the induction of general anesthesia, antibiotic prophylaxis was administered. General endotracheal anesthesia was then administered and tolerated well. After the induction, the abdomen was prepped with Chloraprep and draped in the sterile fashion. The patient was positioned in the supine position.  Local anesthetic agent was injected into the skin near the umbilicus and an incision made. We dissected down to the abdominal fascia with blunt dissection.  The fascia was incised vertically and we entered the peritoneal cavity bluntly.  A pursestring suture of 0-Vicryl was placed around the fascial opening.  The Hasson cannula was inserted and  secured with the stay suture.  Pneumoperitoneum was then created with CO2 and tolerated well without any adverse changes in the patient's vital signs. An 11-mm port was placed in the subxiphoid position.  Two 5-mm ports were placed in the right upper quadrant. All skin incisions were infiltrated with a local anesthetic agent before making the incision and placing the trocars.   We positioned the patient in reverse Trendelenburg, tilted slightly to the patient's left.  The gallbladder was identified, the fundus grasped and retracted cephalad. Adhesions were lysed bluntly and with the electrocautery where indicated, taking care not to injure any adjacent organs or viscus. The infundibulum was grasped and retracted laterally, exposing the peritoneum overlying the triangle of Calot. This was then divided and exposed in a blunt fashion. A critical view of the cystic duct and cystic artery was obtained.  The cystic duct was clearly identified and bluntly dissected circumferentially. The cystic duct was ligated with a clip distally.   An incision was made in the cystic duct and the [facility] cholangiogram catheter introduced. The catheter was secured using a clip. A cholangiogram was then performed, demonstrating dilated common duct and no passage of contrast.  The patient was placed into trendelenburg in order to define the upper biliary tree.  After that, the contrast still would not pass.  The patient was given 0.5 mg glucagon and a tiny trickle of contrast passed into the duodenum.    The cystic duct was then ligated with clips and divided. The cystic artery was identified, dissected free, ligated with clips and divided as well.   The gallbladder was dissected from the liver bed in retrograde fashion with the electrocautery. The gallbladder was removed and placed in an Ecosac.  The gallbladder and Endocatch bag were then removed through the umbilical port site.  The liver bed was irrigated and inspected. Hemostasis  was achieved with the electrocautery. Copious irrigation was utilized and was repeatedly aspirated until clear.    We again inspected the right upper quadrant for hemostasis.  Pneumoperitoneum was released as we removed the trocars.   The pursestring suture was used to close the umbilical fascia.  4-0 Monocryl was used to close the skin.   The skin was cleaned and dry, and Dermabond was applied. The patient was then extubated and brought to the recovery room in stable condition. Instrument, sponge, and needle counts were correct at closure and at the conclusion of the case.   Findings: Acute inflammation, dilated common bile duct.    Estimated Blood Loss: min          Specimens: Gallbladder to pathology       Complications: None; patient tolerated the procedure well.         Disposition: PACU - hemodynamically stable.         Condition: stable

## 2018-05-31 NOTE — H&P (Addendum)
[facility] Admission Note  Meghan Mills July 01, 1991  [id number].    Requesting MD: Kathrynn Humble, MD Chief Complaint/Reason for Consult: symptomatic cholelithiasis   HPI:  Ms. Meghan Mills is a 27 year old female who is 6 weeks postpartum who presented to [facility] emergency department with acute onset abdominal pain.  Pain described as sharp, constant, epigastric pain with radiation to her back.  Pain started around 2 AM, waking her up from her sleep.  She reports eating ribs for dinner and does have a history of eating fatty foods. Patient denies associated fever, nausea, vomiting.  States she experienced similar pain 4 weeks ago around 4:00 in the morning, but after that episode the pain went away.  Patient is a non-smoker and denies alcohol use.  She reports a history of hives after taking "off brand cough syrup ".  Her only regular medication use is a prenatal vitamin.  6 weeks ago she underwent cesarean section for failure to progress during labor.  She denies any complications with epidural or the procedure.  She is currently breast-feeding.  She has not had anything to eat or drink since yesterday.  ED work-up significant for a right upper quadrant ultrasound with cholelithiasis and a dilated common bile duct to 8 mm.  WBC 12.  AST 107, ALT 68, alk phos 466, total bilirubin is within normal limits.  Serum creatinine is 1.5.  UA with moderate leukocytes.  ROS: Review of Systems  Constitutional: Positive for chills (intermittent since her c-section 6 weeks ago). Negative for fever.  Respiratory: Negative.   Cardiovascular: Negative.   Gastrointestinal: Positive for abdominal pain and diarrhea (starting a couple days ago). Negative for blood in stool, constipation, melena, nausea and vomiting.  Genitourinary: Negative for dysuria, hematuria and urgency.  All other systems reviewed and are negative.   History reviewed. No pertinent family history.  History reviewed. No pertinent past  medical history.  Past Surgical History:  Procedure Laterality Date  . CESAREAN SECTION N/A 04/21/2018   Procedure: CESAREAN SECTION;  Surgeon: Molli Posey, MD;  Location: [facility];  Service: Obstetrics;  Laterality: N/A;    Social History:  reports that she has never smoked. She has never used smokeless tobacco. Her alcohol and drug histories are not on file.  Allergies:  Allergies  Allergen Reactions  . Other Hives    [employer] cough medicine      (Not in a hospital admission)  Blood pressure 116/75, pulse (!) 51, temperature (!) 97.5 F (36.4 C), temperature source Oral, resp. rate 18, height '5\' 9"'  (1.753 m), weight 87.5 kg (193 lb), SpO2 99 %, unknown if currently breastfeeding. Physical Exam: Physical Exam  Constitutional: She is oriented to person, place, and time. She appears well-developed and well-nourished.  Non-toxic appearance. She does not appear ill.  HENT:  Head: Normocephalic and atraumatic.  Mouth/Throat: Oropharynx is clear and moist.  Eyes: Pupils are equal, round, and reactive to light. EOM are normal. Scleral icterus is present.  Cardiovascular: Normal rate, regular rhythm, normal heart sounds and intact distal pulses. Exam reveals no friction rub.  No murmur heard. Pulmonary/Chest: Effort normal and breath sounds normal. She has no wheezes. She has no rhonchi. She has no rales.  Abdominal: Soft. Bowel sounds are normal. She exhibits no distension. There is tenderness in the right upper quadrant and epigastric area. There is guarding. There is no rebound and negative Murphy's sign. No hernia.  Neurological: She is alert and oriented to person, place, and time.  Skin: Skin  is warm and dry. Capillary refill takes less than 2 seconds. No rash noted.  Psychiatric: She has a normal mood and affect. Her behavior is normal.    Results for orders placed or performed during the hospital encounter of 05/31/18 (from the past 48 hour(s))  Lipase, blood      Status: None   Collection Time: 05/31/18  5:17 AM  Result Value Ref Range   Lipase 40 11 - 51 U/L    Comment: Performed at [facility], [place] [address] [place], [place] [address]  Comprehensive metabolic panel     Status: Abnormal   Collection Time: 05/31/18  5:17 AM  Result Value Ref Range   Sodium 145 135 - 145 mmol/L   Potassium 3.7 3.5 - 5.1 mmol/L   Chloride 109 101 - 111 mmol/L   CO2 27 22 - 32 mmol/L   Glucose, Bld 120 (H) 65 - 99 mg/dL   BUN 18 6 - 20 mg/dL   Creatinine, Ser 1.50 (H) 0.44 - 1.00 mg/dL   Calcium 9.1 8.9 - 10.3 mg/dL   Total Protein 7.3 6.5 - 8.1 g/dL   Albumin 4.2 3.5 - 5.0 g/dL   AST 107 (H) 15 - 41 U/L   ALT 68 (H) 14 - 54 U/L   Alkaline Phosphatase 466 (H) 38 - 126 U/L   Total Bilirubin 0.6 0.3 - 1.2 mg/dL   GFR calc non Af Amer 47 (L) >60 mL/min   GFR calc Af Amer 54 (L) >60 mL/min    Comment: (NOTE) The eGFR has been calculated using the CKD EPI equation. This calculation has not been validated in all clinical situations. eGFR's persistently <60 mL/min signify possible Chronic Kidney Disease.    Anion gap 9 5 - 15    Comment: Performed at [facility], [place] [address] [place], [place] [address]  CBC     Status: Abnormal   Collection Time: 05/31/18  5:17 AM  Result Value Ref Range   WBC 12.0 (H) 4.0 - 10.5 K/uL   RBC 4.25 3.87 - 5.11 MIL/uL   Hemoglobin 12.0 12.0 - 15.0 g/dL   HCT 37.2 36.0 - 46.0 %   MCV 87.5 78.0 - 100.0 fL   MCH 28.2 26.0 - 34.0 pg   MCHC 32.3 30.0 - 36.0 g/dL   RDW 13.4 11.5 - 15.5 %   Platelets 332 150 - 400 K/uL    Comment: Performed at [facility], [place] [address], [place], [place] [address]  Urinalysis, Routine w reflex microscopic     Status: Abnormal   Collection Time: 05/31/18  5:22 AM  Result Value Ref Range   Color, Urine YELLOW YELLOW   APPearance CLEAR CLEAR   Specific Gravity, Urine 1.024 1.005 - 1.030   pH 6.0 5.0 - 8.0   Glucose, UA NEGATIVE  NEGATIVE mg/dL   Hgb urine dipstick NEGATIVE NEGATIVE   Bilirubin Urine NEGATIVE NEGATIVE   Ketones, ur NEGATIVE NEGATIVE mg/dL   Protein, ur NEGATIVE NEGATIVE mg/dL   Nitrite NEGATIVE NEGATIVE   Leukocytes, UA MODERATE (A) NEGATIVE   RBC / HPF 0-5 0 - 5 RBC/hpf   WBC, UA 0-5 0 - 5 WBC/hpf   Bacteria, UA NONE SEEN NONE SEEN   Squamous Epithelial / LPF 0-5 0 - 5   Mucus PRESENT    Hyaline Casts, UA PRESENT    Non Squamous Epithelial 0-5 (A) NONE SEEN    Comment: Performed at [facility], [address] [place], [place] [address]  I-Stat beta hCG blood, ED     Status: None   Collection Time: 05/31/18  5:23 AM  Result Value Ref Range   I-stat hCG, quantitative <5.0 <5 mIU/mL   Comment 3            Comment:   GEST. AGE      CONC.  (mIU/mL)   <=1 WEEK        5 - 50     2 WEEKS       50 - 500     3 WEEKS       100 - 10,000     4 WEEKS     1,000 - 30,000        FEMALE AND NON-PREGNANT FEMALE:     LESS THAN 5 mIU/mL    US Abdomen Limited Ruq  Result Date: 05/31/2018 CLINICAL DATA:  Right upper quadrant pain EXAM: ULTRASOUND ABDOMEN LIMITED RIGHT UPPER QUADRANT COMPARISON:  None. FINDINGS: Gallbladder: Well distended with gallstones within. No wall thickening or pericholecystic fluid is noted. Common bile duct: Diameter: 8.2 mm. This is significantly increased for the patient's age in raises suspicion for distal common bile duct stone. Correlation with laboratory values is recommended. Liver: No focal lesion identified. Within normal limits in parenchymal echogenicity. Portal vein is patent on color Doppler imaging with normal direction of blood flow towards the liver. IMPRESSION: Cholelithiasis. Prominent common bile duct suspicious for distal common bile duct stone. No intrahepatic ductal dilatation is seen. Correlation with laboratory values is recommended. Electronically Signed   By: Inez Catalina M.D.   On: 05/31/2018 07:44    Assessment/Plan Symptomatic  cholelithiasis, possible early acute cholecystitis Abnormal LFTs  N.p.o., IVF  Dilated common bile duct on ultrasound, normal bilirubin. Will rule out choledocholithiasis with intraoperative cholangiogram.   to OR today for laparoscopic cholecystectomy with IOC  1 dose Rocephin on-call to the OR   Leukocytosis  Possible urinary tract infection- asymptomatic AKI- serum creatinine 1.5, suspect prerenal, IV fluids and repeat be met in a.m.  Jill Alexanders, [facility] 05/31/2018, 8:49 AM Pager: [phone] Consults: [phone] Mon-Fri 7:00 am-4:30 pm Sat-Sun 7:00 am-11:30 am

## 2018-05-31 NOTE — ED Provider Notes (Signed)
[facility]-EMERGENCY DEPT Provider Note   CSN: [id number] Arrival date & time: 05/31/18  0406     History   Chief Complaint Chief Complaint  Patient presents with  . Abdominal Pain    HPI Meghan Mills is a 27 y.o. female.  Patient presents with epigastric abdominal pain that woke her from sleep.  Reports the pain is severe and associated with nausea but no vomiting.  Patient is 6 weeks postpartum and did have a C-section on May 9.  He states she did have some acid reflux during her pregnancy and was on Zantac but is no longer.  She states this feels different.  No fever, chills, vomiting, chest pain or shortness of breath.  No pain with urination or blood in the urine.  She states she is on no medications at this time for her stomach.  Denies any excessive alcohol or NSAID use.  No black or bloody stools.  The history is provided by the patient and a relative.  Abdominal Pain   Associated symptoms include nausea. Pertinent negatives include fever, vomiting, dysuria, hematuria, headaches, arthralgias and myalgias.    History reviewed. No pertinent past medical history.  Patient Active Problem List   Diagnosis Date Noted  . Indication for care in labor or delivery 04/20/2018    Past Surgical History:  Procedure Laterality Date  . CESAREAN SECTION N/A 04/21/2018   Procedure: CESAREAN SECTION;  Surgeon: Richarda Overlie, MD;  Location: [facility];  Service: Obstetrics;  Laterality: N/A;     OB History    Gravida  1   Para  1   Term  1   Preterm      AB      Living  1     SAB      TAB      Ectopic      Multiple  0   Live Births  1            Home Medications    Prior to Admission medications   Medication Sig Start Date End Date Taking? Authorizing Provider  ibuprofen (ADVIL,MOTRIN) 600 MG tablet Take 1 tablet (600 mg total) by mouth 4 (four) times daily. 04/24/18   Morris, Aundra Millet, DO  oxyCODONE-acetaminophen  (PERCOCET/ROXICET) 5-325 MG tablet Take 1 tablet by mouth every 4 (four) hours as needed (pain scale 4-7). 04/24/18   Mitchel Honour, DO  Prenatal Vit-Fe Fumarate-FA (PRENATAL MULTIVITAMIN) TABS tablet Take 1 tablet by mouth daily at 12 noon.    [provider]  ranitidine (ZANTAC) 150 MG tablet Take 150 mg by mouth 2 (two) times daily. 04/11/18   [provider]    Family History History reviewed. No pertinent family history.  Social History Social History   Tobacco Use  . Smoking status: Never Smoker  . Smokeless tobacco: Never Used  Substance Use Topics  . Alcohol use: Not on file  . Drug use: Not on file     Allergies   Other   Review of Systems Review of Systems  Constitutional: Positive for appetite change. Negative for activity change and fever.  Eyes: Negative for visual disturbance.  Respiratory: Negative for cough, chest tightness and shortness of breath.   Cardiovascular: Negative for chest pain.  Gastrointestinal: Positive for abdominal pain and nausea. Negative for blood in stool, rectal pain and vomiting.  Genitourinary: Negative for dysuria, hematuria, vaginal bleeding and vaginal discharge.  Musculoskeletal: Negative for arthralgias, back pain and myalgias.  Skin: Negative for rash.  Neurological: Negative for dizziness, weakness and headaches.    all other systems are negative except as noted in the HPI and PMH.    Physical Exam Updated Vital Signs BP 126/71   Pulse (!) 56   Temp (!) 97.5 F (36.4 C) (Oral)   Resp 16   Ht 5\' 9"  (1.753 m)   Wt 87.5 kg (193 lb)   SpO2 100%   BMI 28.50 kg/m   Physical Exam  Constitutional: She is oriented to person, place, and time. She appears well-developed and well-nourished. No distress.  HENT:  Head: Normocephalic and atraumatic.  Mouth/Throat: Oropharynx is clear and moist. No oropharyngeal exudate.  Eyes: Pupils are equal, round, and reactive to light. Conjunctivae and EOM are normal.    Neck: Normal range of motion. Neck supple.  No meningismus.  Cardiovascular: Normal rate, regular rhythm, normal heart sounds and intact distal pulses.  No murmur heard. Pulmonary/Chest: Effort normal and breath sounds normal. No respiratory distress.  Abdominal: Soft. There is tenderness. There is guarding. There is no rebound.  Epigastric and right upper quadrant tenderness with voluntary guarding.  No rebound.  Pain is worse in the epigastrium. No CVA tenderness  Musculoskeletal: Normal range of motion. She exhibits no edema or tenderness.  Neurological: She is alert and oriented to person, place, and time. No cranial nerve deficit. She exhibits normal muscle tone. Coordination normal.  No ataxia on finger to nose bilaterally. No pronator drift. 5/5 strength throughout. CN 2-12 intact.Equal grip strength. Sensation intact.   Skin: Skin is warm.  Psychiatric: She has a normal mood and affect. Her behavior is normal.  Nursing note and vitals reviewed.    ED Treatments / Results  Labs (all labs ordered are listed, but only abnormal results are displayed) Labs Reviewed  COMPREHENSIVE METABOLIC PANEL - Abnormal; Notable for the following components:      Result Value   Glucose, Bld 120 (*)    Creatinine, Ser 1.50 (*)    AST 107 (*)    ALT 68 (*)    Alkaline Phosphatase 466 (*)    GFR calc non Af Amer 47 (*)    GFR calc Af Amer 54 (*)    All other components within normal limits  CBC - Abnormal; Notable for the following components:   WBC 12.0 (*)    All other components within normal limits  URINALYSIS, ROUTINE W REFLEX MICROSCOPIC - Abnormal; Notable for the following components:   Leukocytes, UA MODERATE (*)    Non Squamous Epithelial 0-5 (*)    All other components within normal limits  LIPASE, BLOOD  I-STAT BETA HCG BLOOD, ED ([facility], [facility], [facility] ONLY)    EKG None  Radiology No results found.  Procedures Procedures (including critical care time)  Medications Ordered in  ED Medications  ondansetron (ZOFRAN) injection 4 mg (has no administration in time range)  sodium chloride 0.9 % bolus 1,000 mL (has no administration in time range)  gi cocktail (Maalox,Lidocaine,Donnatal) (has no administration in time range)  famotidine (PEPCID) IVPB 20 mg premix (has no administration in time range)     Initial Impression / Assessment and Plan / ED Course  I have reviewed the triage vital signs and the nursing notes.  Pertinent labs & imaging results that were available during my care of the patient were reviewed by me and considered in my medical decision making (see chart for details).    Epigastric pain with nausea.  History of acid reflux during pregnancy. RUQ and  epigastrium tenderness, dissimilar to previous GERD pain.  PPI, pepcid, IVF zofran.  WBC 12, LFTs elevated.  Lipase is normal.  Patient with persistent pain.  She is hesitant to try narcotics as she is breast-feeding but she is uncomfortable so she agrees to pump and dump for the next 2 days. Will obtain right upper quadrant ultrasound given her ongoing pain and elevated LFTs.  RUQ US pending at time of sign out to Dr. Rhunette CroftNanavati.    Final Clinical Impressions(s) / ED Diagnoses   Final diagnoses:  None    ED Discharge Orders    None       Emelina Hinch, Jeannett SeniorStephen, MD 05/31/18 [phone]

## 2018-06-01 ENCOUNTER — Encounter (HOSPITAL_COMMUNITY): Payer: Self-pay | Admitting: General Surgery

## 2018-06-01 ENCOUNTER — Observation Stay (HOSPITAL_COMMUNITY): Payer: BLUE CROSS/BLUE SHIELD

## 2018-06-01 DIAGNOSIS — K8020 Calculus of gallbladder without cholecystitis without obstruction: Secondary | ICD-10-CM | POA: Diagnosis not present

## 2018-06-01 DIAGNOSIS — R945 Abnormal results of liver function studies: Secondary | ICD-10-CM | POA: Diagnosis not present

## 2018-06-01 DIAGNOSIS — Z9049 Acquired absence of other specified parts of digestive tract: Secondary | ICD-10-CM | POA: Diagnosis not present

## 2018-06-01 DIAGNOSIS — K8050 Calculus of bile duct without cholangitis or cholecystitis without obstruction: Secondary | ICD-10-CM | POA: Diagnosis not present

## 2018-06-01 LAB — HIV ANTIBODY (ROUTINE TESTING W REFLEX): HIV SCREEN 4TH GENERATION: NONREACTIVE

## 2018-06-01 LAB — CBC
HCT: 34 % — ABNORMAL LOW (ref 36.0–46.0)
Hemoglobin: 11.2 g/dL — ABNORMAL LOW (ref 12.0–15.0)
MCH: 28.1 pg (ref 26.0–34.0)
MCHC: 32.9 g/dL (ref 30.0–36.0)
MCV: 85.4 fL (ref 78.0–100.0)
Platelets: 316 10*3/uL (ref 150–400)
RBC: 3.98 MIL/uL (ref 3.87–5.11)
RDW: 13.4 % (ref 11.5–15.5)
WBC: 8.4 10*3/uL (ref 4.0–10.5)

## 2018-06-01 LAB — COMPREHENSIVE METABOLIC PANEL
ALBUMIN: 3.6 g/dL (ref 3.5–5.0)
ALK PHOS: 487 U/L — AB (ref 38–126)
ALT: 699 U/L — ABNORMAL HIGH (ref 14–54)
ANION GAP: 8 (ref 5–15)
AST: 608 U/L — ABNORMAL HIGH (ref 15–41)
BUN: 10 mg/dL (ref 6–20)
CALCIUM: 8.9 mg/dL (ref 8.9–10.3)
CO2: 25 mmol/L (ref 22–32)
Chloride: 111 mmol/L (ref 101–111)
Creatinine, Ser: 0.89 mg/dL (ref 0.44–1.00)
GLUCOSE: 123 mg/dL — AB (ref 65–99)
POTASSIUM: 4 mmol/L (ref 3.5–5.1)
Sodium: 144 mmol/L (ref 135–145)
TOTAL PROTEIN: 6.3 g/dL — AB (ref 6.5–8.1)
Total Bilirubin: 2.4 mg/dL — ABNORMAL HIGH (ref 0.3–1.2)

## 2018-06-01 MED ORDER — GADOBENATE DIMEGLUMINE 529 MG/ML IV SOLN
20.0000 mL | Freq: Once | INTRAVENOUS | Status: AC | PRN
  Administered 2018-06-01: 18 mL via INTRAVENOUS

## 2018-06-01 NOTE — Progress Notes (Signed)
Subjective: She was seen and examined at bedside. She was started on a regular diet, denies passing flatus or bowel movement, complains of abdominal soreness, denies nausea vomiting, denies fever.  Objective: Vital signs in last 24 hours: Temp:  [97.5 F (36.4 C)-98.1 F (36.7 C)] 98.1 F (36.7 C) (06/19 1408) Pulse Rate:  [41-48] 44 (06/19 1408) Resp:  [16-18] 16 (06/19 1408) BP: (110-148)/(62-84) 148/84 (06/19 1408) SpO2:  [97 %-100 %] 100 % (06/19 1408) Weight change:  Last BM Date: 05/28/18  PE:in mild distress from generalized abdominal pain GENERAL:mild icterus, mild pallor ABDOMEN:soft, mild generalized tenderness, hypoactive bowel sounds EXTREMITIES:no edema  Lab Results: Results for orders placed or performed during the hospital encounter of 05/31/18 (from the past 48 hour(s))  Lipase, blood     Status: None   Collection Time: 05/31/18  5:17 AM  Result Value Ref Range   Lipase 40 11 - 51 U/L    Comment: Performed at [facility], [place] [address] [place], [place] [address]  Comprehensive metabolic panel     Status: Abnormal   Collection Time: 05/31/18  5:17 AM  Result Value Ref Range   Sodium 145 135 - 145 mmol/L   Potassium 3.7 3.5 - 5.1 mmol/L   Chloride 109 101 - 111 mmol/L   CO2 27 22 - 32 mmol/L   Glucose, Bld 120 (H) 65 - 99 mg/dL   BUN 18 6 - 20 mg/dL   Creatinine, Ser 1.50 (H) 0.44 - 1.00 mg/dL   Calcium 9.1 8.9 - 10.3 mg/dL   Total Protein 7.3 6.5 - 8.1 g/dL   Albumin 4.2 3.5 - 5.0 g/dL   AST 107 (H) 15 - 41 U/L   ALT 68 (H) 14 - 54 U/L   Alkaline Phosphatase 466 (H) 38 - 126 U/L   Total Bilirubin 0.6 0.3 - 1.2 mg/dL   GFR calc non Af Amer 47 (L) >60 mL/min   GFR calc Af Amer 54 (L) >60 mL/min    Comment: (NOTE) The eGFR has been calculated using the CKD EPI equation. This calculation has not been validated in all clinical situations. eGFR's persistently <60 mL/min signify possible Chronic Kidney Disease.    Anion gap 9 5 - 15     Comment: Performed at [facility], [place] [address], [place], [place] [address]  CBC     Status: Abnormal   Collection Time: 05/31/18  5:17 AM  Result Value Ref Range   WBC 12.0 (H) 4.0 - 10.5 K/uL   RBC 4.25 3.87 - 5.11 MIL/uL   Hemoglobin 12.0 12.0 - 15.0 g/dL   HCT 37.2 36.0 - 46.0 %   MCV 87.5 78.0 - 100.0 fL   MCH 28.2 26.0 - 34.0 pg   MCHC 32.3 30.0 - 36.0 g/dL   RDW 13.4 11.5 - 15.5 %   Platelets 332 150 - 400 K/uL    Comment: Performed at [facility], [place] [address], [place], [place] [address]  Urinalysis, Routine w reflex microscopic     Status: Abnormal   Collection Time: 05/31/18  5:22 AM  Result Value Ref Range   Color, Urine YELLOW YELLOW   APPearance CLEAR CLEAR   Specific Gravity, Urine 1.024 1.005 - 1.030   pH 6.0 5.0 - 8.0   Glucose, UA NEGATIVE NEGATIVE mg/dL   Hgb urine dipstick NEGATIVE NEGATIVE   Bilirubin Urine NEGATIVE NEGATIVE   Ketones, ur NEGATIVE NEGATIVE mg/dL   Protein, ur NEGATIVE NEGATIVE mg/dL  Nitrite NEGATIVE NEGATIVE   Leukocytes, UA MODERATE (A) NEGATIVE   RBC / HPF 0-5 0 - 5 RBC/hpf   WBC, UA 0-5 0 - 5 WBC/hpf   Bacteria, UA NONE SEEN NONE SEEN   Squamous Epithelial / LPF 0-5 0 - 5   Mucus PRESENT    Hyaline Casts, UA PRESENT    Non Squamous Epithelial 0-5 (A) NONE SEEN    Comment: Performed at [facility], [place] [address], [place], [place] [address]  I-Stat beta hCG blood, ED     Status: None   Collection Time: 05/31/18  5:23 AM  Result Value Ref Range   I-stat hCG, quantitative <5.0 <5 mIU/mL   Comment 3            Comment:   GEST. AGE      CONC.  (mIU/mL)   <=1 WEEK        5 - 50     2 WEEKS       50 - 500     3 WEEKS       100 - 10,000     4 WEEKS     1,000 - 30,000        FEMALE AND NON-PREGNANT FEMALE:     LESS THAN 5 mIU/mL   HIV antibody (Routine Testing)     Status: None   Collection Time: 05/31/18  1:31 PM  Result Value Ref Range   HIV Screen 4th Generation  wRfx Non Reactive Non Reactive    Comment: (NOTE) Performed At: [facility] [place], [place] [id number] Rush Farmer MD PP:[id number] Performed at [facility], [place] [address] [place], [place] [address]   Comprehensive metabolic panel     Status: Abnormal   Collection Time: 06/01/18  4:45 AM  Result Value Ref Range   Sodium 144 135 - 145 mmol/L   Potassium 4.0 3.5 - 5.1 mmol/L   Chloride 111 101 - 111 mmol/L   CO2 25 22 - 32 mmol/L   Glucose, Bld 123 (H) 65 - 99 mg/dL   BUN 10 6 - 20 mg/dL   Creatinine, Ser 0.89 0.44 - 1.00 mg/dL   Calcium 8.9 8.9 - 10.3 mg/dL   Total Protein 6.3 (L) 6.5 - 8.1 g/dL   Albumin 3.6 3.5 - 5.0 g/dL   AST 608 (H) 15 - 41 U/L   ALT 699 (H) 14 - 54 U/L   Alkaline Phosphatase 487 (H) 38 - 126 U/L   Total Bilirubin 2.4 (H) 0.3 - 1.2 mg/dL   GFR calc non Af Amer >60 >60 mL/min   GFR calc Af Amer >60 >60 mL/min    Comment: (NOTE) The eGFR has been calculated using the CKD EPI equation. This calculation has not been validated in all clinical situations. eGFR's persistently <60 mL/min signify possible Chronic Kidney Disease.    Anion gap 8 5 - 15    Comment: Performed at [facility], [place] [address] [place],  [address]  CBC     Status: Abnormal   Collection Time: 06/01/18  4:45 AM  Result Value Ref Range   WBC 8.4 4.0 - 10.5 K/uL   RBC 3.98 3.87 - 5.11 MIL/uL   Hemoglobin 11.2 (L) 12.0 - 15.0 g/dL   HCT 34.0 (L) 36.0 - 46.0 %   MCV 85.4 78.0 - 100.0 fL   MCH 28.1 26.0 - 34.0 pg   MCHC 32.9 30.0 - 36.0 g/dL   RDW  13.4 11.5 - 15.5 %   Platelets 316 150 - 400 K/uL    Comment: Performed at [facility], [place] [address] [place], [place] [address]    Studies/Results: Dg Cholangiogram Operative  Result Date: 05/31/2018 CLINICAL DATA:  Intraoperative cholangiogram during laparoscopic cholecystectomy. EXAM: INTRAOPERATIVE CHOLANGIOGRAM FLUOROSCOPY TIME:  50  seconds (12.6 mGy) COMPARISON:  Right upper quadrant abdominal ultrasound-05/31/2018 FINDINGS: Intraoperative cholangiographic images of the right upper abdominal quadrant during laparoscopic cholecystectomy are provided for review. Surgical clips overlie the expected location of the gallbladder fossa. Contrast injection demonstrates selective cannulation of the central aspect of the cystic duct. There is passage of contrast through the central aspect of the cystic duct with filling of a non dilated common bile duct. There is passage of contrast though the CBD and into the descending portion of the duodenum. There is minimal reflux of injected contrast into the common hepatic duct and central aspect of the non dilated intrahepatic biliary system. There are no discrete filling defects within the opacified portions of the biliary system to suggest the presence of choledocholithiasis. IMPRESSION: No evidence of choledocholithiasis. Electronically Signed   By: Sandi Mariscal M.D.   On: 05/31/2018 12:31   Mr 3d Recon At Scanner  Result Date: 06/01/2018 CLINICAL DATA:  Status post cholecystectomy. Common bile duct stones suspected. EXAM: MRI ABDOMEN WITHOUT AND WITH CONTRAST (INCLUDING MRCP) TECHNIQUE: Multiplanar multisequence MR imaging of the abdomen was performed both before and after the administration of intravenous contrast. Heavily T2-weighted images of the biliary and pancreatic ducts were obtained, and three-dimensional MRCP images were rendered by post processing. CONTRAST:  91m MULTIHANCE GADOBENATE DIMEGLUMINE 529 MG/ML IV SOLN COMPARISON:  Abdominal ultrasound examination 05/31/2018. FINDINGS: Lower chest: Minimal streaky bibasilar atelectasis. No pleural or pericardial effusion. Marked increased T2 signal intensity in the breast tissue most likely due to lactation. Hepatobiliary: No focal hepatic lesions or intrahepatic biliary dilatation. There is expected postoperative changes in the gallbladder fossa  with some residual fluid and possible small amount of hemorrhage. The common bile duct demonstrates normal caliber and course. No common bile duct stones are identified. Pancreas:  No mass, inflammation or ductal dilatation. Spleen:  Normal size.  No focal lesions. Adrenals/Urinary Tract: The adrenal glands and kidneys are unremarkable. Stomach/Bowel: Visualized portions within the abdomen are unremarkable. Vascular/Lymphatic: No pathologically enlarged lymph nodes identified. No abdominal aortic aneurysm demonstrated. Other: Expected postoperative fluid around the second portion the duodenum and a small amount of fluid in the hepatorenal space. Musculoskeletal: No significant bony findings. IMPRESSION: 1. Expected postoperative changes from recent cholecystectomy with a small amount of residual fluid in the gallbladder fossa, surrounding the second portion the duodenum and in the anterior pararenal space. 2. No intra or extrahepatic biliary dilatation. Normal caliber and course of the common bile duct without evidence of common bile duct stones. 3. Normal pancreas. Electronically Signed   By: PMarijo SanesM.D.   On: 06/01/2018 09:51   Mr Abdomen Mrcp WMoise BoringContast  Result Date: 06/01/2018 CLINICAL DATA:  Status post cholecystectomy. Common bile duct stones suspected. EXAM: MRI ABDOMEN WITHOUT AND WITH CONTRAST (INCLUDING MRCP) TECHNIQUE: Multiplanar multisequence MR imaging of the abdomen was performed both before and after the administration of intravenous contrast. Heavily T2-weighted images of the biliary and pancreatic ducts were obtained, and three-dimensional MRCP images were rendered by post processing. CONTRAST:  162mMULTIHANCE GADOBENATE DIMEGLUMINE 529 MG/ML IV SOLN COMPARISON:  Abdominal ultrasound examination 05/31/2018. FINDINGS: Lower chest: Minimal streaky bibasilar atelectasis. No pleural  or pericardial effusion. Marked increased T2 signal intensity in the breast tissue most likely due to  lactation. Hepatobiliary: No focal hepatic lesions or intrahepatic biliary dilatation. There is expected postoperative changes in the gallbladder fossa with some residual fluid and possible small amount of hemorrhage. The common bile duct demonstrates normal caliber and course. No common bile duct stones are identified. Pancreas:  No mass, inflammation or ductal dilatation. Spleen:  Normal size.  No focal lesions. Adrenals/Urinary Tract: The adrenal glands and kidneys are unremarkable. Stomach/Bowel: Visualized portions within the abdomen are unremarkable. Vascular/Lymphatic: No pathologically enlarged lymph nodes identified. No abdominal aortic aneurysm demonstrated. Other: Expected postoperative fluid around the second portion the duodenum and a small amount of fluid in the hepatorenal space. Musculoskeletal: No significant bony findings. IMPRESSION: 1. Expected postoperative changes from recent cholecystectomy with a small amount of residual fluid in the gallbladder fossa, surrounding the second portion the duodenum and in the anterior pararenal space. 2. No intra or extrahepatic biliary dilatation. Normal caliber and course of the common bile duct without evidence of common bile duct stones. 3. Normal pancreas. Electronically Signed   By: Marijo Sanes M.D.   On: 06/01/2018 09:51   US Abdomen Limited Ruq  Result Date: 05/31/2018 CLINICAL DATA:  Right upper quadrant pain EXAM: ULTRASOUND ABDOMEN LIMITED RIGHT UPPER QUADRANT COMPARISON:  None. FINDINGS: Gallbladder: Well distended with gallstones within. No wall thickening or pericholecystic fluid is noted. Common bile duct: Diameter: 8.2 mm. This is significantly increased for the patient's age in raises suspicion for distal common bile duct stone. Correlation with laboratory values is recommended. Liver: No focal lesion identified. Within normal limits in parenchymal echogenicity. Portal vein is patent on color Doppler imaging with normal direction of blood  flow towards the liver. IMPRESSION: Cholelithiasis. Prominent common bile duct suspicious for distal common bile duct stone. No intrahepatic ductal dilatation is seen. Correlation with laboratory values is recommended. Electronically Signed   By: Inez Catalina M.D.   On: 05/31/2018 07:44    Medications: I have reviewed the patient's current medications.  Assessment: 1.Abnormal LFTs, T bili increased from 0.6-2.4, AST increased from 107-608,ALT increased from 68 to 669, ALP increased from 466-487. 2.Unremarkable Intra-Op cholangiogram and MRCP, normal caliber of CBD with no intra-or extrahepatic biliary dilatation, no choledocholithiasis noted?ampullary spasm  Plan: LFTs in a.m., if improving- okay to DC home and follow-up as an outpatient,if not trending down,plan diagnostic ERCP. Discussed with patient and her family at bedside. They understand and agree with the plan. Discussed with the surgical team   Ronnette Juniper 06/01/2018, 2:51 PM   Pager [phone] If no answer or after 5 PM call [phone]

## 2018-06-01 NOTE — Progress Notes (Addendum)
1 Day Post-Op    CC:  Acute abdominal pain  Subjective: She is sore and hungry this AM.  Sites are OK.  Awaiting plans from GI  Objective: Vital signs in last 24 hours: Temp:  [97.5 F (36.4 C)-98.8 F (37.1 C)] 98 F (36.7 C) (06/19 0947) Pulse Rate:  [41-68] 42 (06/19 0947) Resp:  [12-18] 16 (06/19 0947) BP: (110-132)/(62-88) 126/83 (06/19 0947) SpO2:  [97 %-100 %] 97 % (06/19 0947) Last BM Date: 05/28/18 240 Po last PM 2700 IV 1000 urine Afebrile, VSS LFT's rising  WBC is back to normal Creatinine back to normal   MRCP completed this ZO:[id number]:Expected postoperative changes from recent cholecystectomy with a small amount of residual fluid in the gallbladder fossa, surrounding the second portion the duodenum and in the anterior pararenal space.  No intra or extrahepatic biliary dilatation. Normal caliber and course of the common bile duct without evidence of common bile duct Stones.   Normal pancreas.    Recent Labs  Lab 05/31/18 0517 06/01/18 0445  AST 107* 608*  ALT 68* 699*  ALKPHOS 466* 487*  BILITOT 0.6 2.4*  PROT 7.3 6.3*  ALBUMIN 4.2 3.6    Intake/Output from previous day: 06/18 0701 - 06/19 0700 In: 2923.8 [P.O.:240; I.V.:2683.8] Out: 1015 [Urine:1000; Blood:15] Intake/Output this shift: Total I/O In: 0  Out: 400 [Urine:400]  General appearance: alert, cooperative and no distress Resp: clear to auscultation bilaterally GI: soft sore, sites oK  Lab Results:  Recent Labs    05/31/18 0517 06/01/18 0445  WBC 12.0* 8.4  HGB 12.0 11.2*  HCT 37.2 34.0*  PLT 332 316   Recent Labs  Lab 05/31/18 0517 06/01/18 0445  AST 107* 608*  ALT 68* 699*  ALKPHOS 466* 487*  BILITOT 0.6 2.4*  PROT 7.3 6.3*  ALBUMIN 4.2 3.6     Lipase     Component Value Date/Time   LIPASE 40 05/31/2018 0517   . cefTRIAXone (ROCEPHIN)  IV 2 g (06/01/18 1012)  . lactated ringers 125 mL/hr at 06/01/18 0215   Antibiotics Given (last 72 hours)    Date/Time Action  Medication Dose Rate   05/31/18 0910 New Bag/Given   cefTRIAXone (ROCEPHIN) 2 g in sodium chloride 0.9 % 100 mL IVPB 2 g 200 mL/hr   06/01/18 1012 New Bag/Given   cefTRIAXone (ROCEPHIN) 2 g in sodium chloride 0.9 % 100 mL IVPB 2 g 200 mL/hr       Medications: . acetaminophen  1,000 mg Oral Q8H    Assessment/Plan Elevated LFT's  - up more post op day 1 AKI - creatinine elevated 1.5 =>> normal post op day 1   Acute calculous cholecystitis/possible choledocholithiasis Laparoscopic cholecystectomy with abnormal IOC, 05/31/18, Dr. Almond LintFaera Byerly  - negative MRCP/ elevation of LFT from admit   FEN:  IV fluids/NPO ID:  Rocephin 6/18 =>> day 2 DVT:  SCD's Follow up:  [facility] clinic -    Plan:  Her MRCP didn't really show any stones, but her LFT's have increased significantly. Discussed with DR. Marca AnconaKarki, and she thinks she is having some spasm.  We will let her eat and get up and around today.  Recheck labs in AM.  If LFT's improve we will let her go home tomorrow, if LFT's have not improved or gone up more Dr. Marca AnconaKarki will arrange for ERCP tomorrow.    LOS: 0 days    Gabrien Mentink 06/01/2018 [phone]

## 2018-06-02 LAB — COMPREHENSIVE METABOLIC PANEL
ALBUMIN: 3.1 g/dL — AB (ref 3.5–5.0)
ALK PHOS: 450 U/L — AB (ref 38–126)
ALT: 511 U/L — ABNORMAL HIGH (ref 14–54)
AST: 251 U/L — ABNORMAL HIGH (ref 15–41)
Anion gap: 6 (ref 5–15)
BILIRUBIN TOTAL: 2.4 mg/dL — AB (ref 0.3–1.2)
BUN: 10 mg/dL (ref 6–20)
CALCIUM: 8.6 mg/dL — AB (ref 8.9–10.3)
CO2: 27 mmol/L (ref 22–32)
Chloride: 111 mmol/L (ref 101–111)
Creatinine, Ser: 0.97 mg/dL (ref 0.44–1.00)
GFR calc Af Amer: >60 mL/min (ref >60)
GLUCOSE: 105 mg/dL — AB (ref 65–99)
POTASSIUM: 3.7 mmol/L (ref 3.5–5.1)
Sodium: 144 mmol/L (ref 135–145)
TOTAL PROTEIN: 5.7 g/dL — AB (ref 6.5–8.1)

## 2018-06-02 LAB — CBC
HEMATOCRIT: 34.4 % — AB (ref 36.0–46.0)
HEMOGLOBIN: 10.9 g/dL — AB (ref 12.0–15.0)
MCH: 27.8 pg (ref 26.0–34.0)
MCHC: 31.7 g/dL (ref 30.0–36.0)
MCV: 87.8 fL (ref 78.0–100.0)
Platelets: 301 10*3/uL (ref 150–400)
RBC: 3.92 MIL/uL (ref 3.87–5.11)
RDW: 13.9 % (ref 11.5–15.5)
WBC: 6.5 10*3/uL (ref 4.0–10.5)

## 2018-06-02 MED ORDER — IBUPROFEN 600 MG PO TABS: ORAL_TABLET | ORAL | 0 refills | Status: DC

## 2018-06-02 MED ORDER — ACETAMINOPHEN 500 MG PO TABS: ORAL_TABLET | ORAL | 0 refills | Status: DC

## 2018-06-02 MED ORDER — OXYCODONE HCL 5 MG PO TABS: 5.0000 mg | ORAL_TABLET | ORAL | 0 refills | Status: DC | PRN

## 2018-06-02 NOTE — Progress Notes (Signed)
LFTs trending down TB 2.4 to 2.4 AST 608 to 251 ALT 699 to 511 ALP 487 to 450  With an unremarkable cholangiogram and normal MRCP,no plans for ERCP, recommend OK to D/C home. Plan repeat hepatic panel in 1 week and f/u with me in office. Discussed with surgical team.   Kerin SalenArya Mikhael Hendriks, MD

## 2018-06-02 NOTE — Discharge Instructions (Signed)
[facility] ______[facility] °LAPAROSCOPIC SURGERY: POST OP INSTRUCTIONS °Always review your discharge instruction sheet given to you by the facility where your surgery was performed. °IF YOU HAVE DISABILITY OR FAMILY LEAVE FORMS, YOU MUST BRING THEM TO THE OFFICE FOR PROCESSING.   °DO NOT GIVE THEM TO YOUR DOCTOR. ° °1. A prescription for pain medication may be given to you upon discharge.  Take your pain medication as prescribed, if needed.  If narcotic pain medicine is not needed, then you may take acetaminophen (Tylenol) or ibuprofen (Advil) as needed. °2. Take your usually prescribed medications unless otherwise directed. °3. If you need a refill on your pain medication, please contact your pharmacy.  They will contact our office to request authorization. Prescriptions will not be filled after 5pm or on week-ends. °4. You should follow a light diet the first few days after arrival home, such as soup and crackers, etc.  Be sure to include lots of fluids daily. °5. Most patients will experience some swelling and bruising in the area of the incisions.  Ice packs will help.  Swelling and bruising can take several days to resolve.  °6. It is common to experience some constipation if taking pain medication after surgery.  Increasing fluid intake and taking a stool softener (such as Colace) will usually help or prevent this problem from occurring.  A mild laxative (Milk of Magnesia or Miralax) should be taken according to package instructions if there are no bowel movements after 48 hours. °7. Unless discharge instructions indicate otherwise, you may remove your bandages 24-48 hours after surgery, and you may shower at that time.  You may have steri-strips (small skin tapes) in place directly over the incision.  These strips should be left on the skin for 7-10 days.  If your surgeon used skin glue on the incision, you may shower in 24 hours.  The glue will flake off over the next 2-3 weeks.  Any sutures or  staples will be removed at the office during your follow-up visit. °8. ACTIVITIES:  You may resume regular (light) daily activities beginning the next day--such as daily self-care, walking, climbing stairs--gradually increasing activities as tolerated.  You may have sexual intercourse when it is comfortable.  Refrain from any heavy lifting or straining until approved by your doctor. °a. You may drive when you are no longer taking prescription pain medication, you can comfortably wear a seatbelt, and you can safely maneuver your car and apply brakes. °b. RETURN TO WORK:  __________________________________________________________ °9. You should see your doctor in the office for a follow-up appointment approximately 2-3 weeks after your surgery.  Make sure that you call for this appointment within a day or two after you arrive home to insure a convenient appointment time. °10. OTHER INSTRUCTIONS: __________________________________________________________________________________________________________________________ __________________________________________________________________________________________________________________________ °WHEN TO CALL YOUR DOCTOR: °1. Fever over 101.0 °2. Inability to urinate °3. Continued bleeding from incision. °4. Increased pain, redness, or drainage from the incision. °5. Increasing abdominal pain ° °The clinic staff is available to answer your questions during regular business hours.  Please don’t hesitate to call and ask to speak to one of the nurses for clinical concerns.  If you have a medical emergency, go to the nearest emergency room or call 911.  A surgeon from [facility] is always on call at the hospital. °[address], [address], [place], [place]  [address] ? [address] [address], [place],    [address] °[phone] ? [phone] ? FAX [phone] °Web site:   [email or web address]  Call your obstetric doctor and let them know you had  surgery also.

## 2018-06-02 NOTE — Progress Notes (Signed)
2 Days Post-Op    ZO:XWRUECC:acute abdominal pain  Subjective: She looks good and is sore but no other complaints.    Objective: Vital signs in last 24 hours: Temp:  [98.1 F (36.7 C)-99 F (37.2 C)] 99 F (37.2 C) (06/19 2045) Pulse Rate:  [44-53] 53 (06/19 2045) Resp:  [16-18] 18 (06/19 2045) BP: (120-148)/(78-84) 120/78 (06/19 2045) SpO2:  [95 %-100 %] 95 % (06/19 2045) Last BM Date: 05/28/18 970 PO 3000 IV 400 urine recorded Afebrile,, VSS Labs improving slowly WBC is normal   Intake/Output from previous day: 06/19 0701 - 06/20 0700 In: 3970 [P.O.:970; I.V.:3000] Out: 400 [Urine:400] Intake/Output this shift: No intake/output data recorded.  General appearance: alert, cooperative and no distress Resp: clear to auscultation bilaterally GI: sore,but sites are fine tolerating diet.    Lab Results:  Recent Labs    06/01/18 0445 06/02/18 0408  WBC 8.4 6.5  HGB 11.2* 10.9*  HCT 34.0* 34.4*  PLT 316 301    BMET Recent Labs    06/01/18 0445 06/02/18 0408  NA 144 144  K 4.0 3.7  CL 111 111  CO2 25 27  GLUCOSE 123* 105*  BUN 10 10  CREATININE 0.89 0.97  CALCIUM 8.9 8.6*   PT/INR No results for input(s): LABPROT, INR in the last 72 hours.  Recent Labs  Lab 05/31/18 0517 06/01/18 0445 06/02/18 0408  AST 107* 608* 251*  ALT 68* 699* 511*  ALKPHOS 466* 487* 450*  BILITOT 0.6 2.4* 2.4*  PROT 7.3 6.3* 5.7*  ALBUMIN 4.2 3.6 3.1*     Lipase     Component Value Date/Time   LIPASE 40 05/31/2018 0517     Medications: . acetaminophen  1,000 mg Oral Q8H   Anti-infectives (From admission, onward)   Start     Dose/Rate Route Frequency Ordered Stop   06/01/18 1000  cefTRIAXone (ROCEPHIN) 2 g in sodium chloride 0.9 % 100 mL IVPB     2 g 200 mL/hr over 30 Minutes Intravenous Every 24 hours 05/31/18 1320 06/01/18 1042   05/31/18 0900  cefTRIAXone (ROCEPHIN) 2 g in sodium chloride 0.9 % 100 mL IVPB    Note to Pharmacy:  Pharmacy may adjust dosing strength /  duration / interval for maximal efficacy   2 g 200 mL/hr over 30 Minutes Intravenous On call to O.R. 05/31/18 0849 05/31/18 0940      Assessment/Plan Elevated LFT's  - up more post op day 1 AKI - creatinine elevated 1.5 =>> normal post op day 1   Acute calculous cholecystitis/possible choledocholithiasis Laparoscopic cholecystectomy with abnormal IOC, 05/31/18, Dr. Almond LintFaera Byerly  - negative MRCP/ elevation of LFT from admit - LFT's trending down, Dr. Marca AnconaKarki wants to send her home and follow up next week      with LFT's in the office.    FEN:  IV fluids/regular ID:  Rocephin 6/18 -6/19, stopped today DVT:  SCD's Follow up:  [facility] clinic -     Plan:  Home today and follow up in [facility] clinic 2 weeks and Dr. Marca AnconaKarki next week.   I have personally reviewed the patients medication history on the [place] controlled substance database.       LOS: 0 days    Marsha Gundlach 06/02/2018 [phone]

## 2018-06-02 NOTE — Discharge Summary (Signed)
Physician Discharge Summary  Patient ID: Meghan Mills MRN: [id number] DOB/AGE: April 06, 1991 27 y.o.  Admit date: 05/31/2018 Discharge date: 06/02/2018  Admission Diagnoses:  Symptomatic cholelithiasis, possible early acute cholecystitis Abnormal LFT's Possible UTI  AKI Post partum   Discharge Diagnoses:  Acute calculous cholecystitis/possible choledocholithiasis Laparoscopic cholecystectomy withabnormalIOC, 05/31/18, Dr. Stark Klein Elevated LFT's- up more post op day 1 AKI - creatinine elevated 1.5=>> normal post op day 1 Post partum    Active Problems:   Acute cholecystitis   PROCEDURES: Laparoscopic cholecystectomy withabnormalIOC, 05/31/18, Dr. Sallye Lat Course:  Meghan Mills is a 27 year old female who is 6 weeks postpartum who presented to [facility] emergency department with acute onset abdominal pain.  Pain described as sharp, constant, epigastric pain with radiation to her back.  Pain started around 2 AM, waking her up from her sleep.  She reports eating ribs for dinner and does have a history of eating fatty foods. Patient denies associated fever, nausea, vomiting.  States she experienced similar pain 4 weeks ago around 4:00 in the morning, but after that episode the pain went away.  Patient is a non-smoker and denies alcohol use.  She reports a history of hives after taking "off brand cough syrup ".  Her only regular medication use is a prenatal vitamin.  6 weeks ago she underwent cesarean section for failure to progress during labor.  She denies any complications with epidural or the procedure.  She is currently breast-feeding.  She has not had anything to eat or drink since yesterday.  ED work-up significant for a right upper quadrant ultrasound with cholelithiasis and a dilated common bile duct to 8 mm.  WBC 12.  AST 107, ALT 68, alk phos 466, total bilirubin is within normal limits.  Serum creatinine is 1.5.  UA with moderate leukocytes.  She was  admitted and taken to the OR.  She had an IOC that showed some flow but was not completely normal. We ordered an MRCP and obtained a GI consult. She was seen by Dr Therisa Doyne from GI.  IOC read by radiology saw no discrete filling defects and saw no evidence of choledocholithiasis.  MRCP showed a similar finding: No intra or extrahepatic biliary dilatation. Normal caliber and course of the common bile duct without evidence of common bile duct stones.  Normal post op changes, and a normal pancreas. LFT's post op went up dramatically, but Dr. Therisa Doyne things it is spasm.  We kept her overnight and repeated the LFT's.  Her bilirubin did not decline, but the transaminases did show a modest decline.  It was Dr. Encarnacion Slates opinion she could go home and she will follow up with her next week and check her LFT's.  She will come back to our clinic in 2 weeks.    CBC Latest Ref Rng & Units 06/02/2018 06/01/2018 05/31/2018  WBC 4.0 - 10.5 K/uL 6.5 8.4 12.0(H)  Hemoglobin 12.0 - 15.0 g/dL 10.9(L) 11.2(L) 12.0  Hematocrit 36.0 - 46.0 % 34.4(L) 34.0(L) 37.2  Platelets 150 - 400 K/uL 301 316 332    CMP Latest Ref Rng & Units 06/02/2018 06/01/2018 05/31/2018  Glucose 65 - 99 mg/dL 105(H) 123(H) 120(H)  BUN 6 - 20 mg/dL _0 Creatinine 0.44 - 1.00 mg/dL 0.97 0.89 1.50(H)  Sodium 135 - 145 mmol/L 144 144 145  Potassium 3.5 - 5.1 mmol/L 3.7 4.0 3.7  Chloride 101 - 111 mmol/L 111 111 109  CO2 22 - 32 mmol/L 27 25 27  Calcium 8.9 - 10.3 mg/dL 8.6(L) 8.9 9.1  Total Protein 6.5 - 8.1 g/dL 5.7(L) 6.3(L) 7.3  Total Bilirubin 0.3 - 1.2 mg/dL 2.4(H) 2.4(H) 0.6  Alkaline Phos 38 - 126 U/L 450(H) 487(H) 466(H)  AST 15 - 41 U/L 251(H) 608(H) 107(H)  ALT 14 - 54 U/L 511(H) 699(H) 68(H)   MRCP:1. Expected postoperative changes from recent cholecystectomy with a small amount of residual fluid in the gallbladder fossa, surrounding the second portion the duodenum and in the anterior pararenal space. 2. No intra or extrahepatic biliary  dilatation. Normal caliber and course of the common bile duct without evidence of common bile duct stones. 3. Normal pancreas.   IOC:No evidence of choledocholithiasis.  Condition on DC:  Improved     Disposition: Home   Allergies as of 06/02/2018      Reactions   Other Hives   [employer] cough medicine       Medication List    STOP taking these medications   oxyCODONE-acetaminophen 5-325 MG tablet Commonly known as:  PERCOCET/ROXICET     TAKE these medications   acetaminophen 500 MG tablet Commonly known as:  TYLENOL You can take plain Tylenol for pain.  It comes in the 325 mg and the 500 mg.  You can take 1000 mg every 8 hours as needed and alternate with the ibuprofen, or the oxycodone if you need that.   Do not take more than 4000 mg of Tylenol per day.  It can harm your liver.   hydrocortisone cream 1 % Apply 1 application topically 2 (two) times daily as needed for itching.   ibuprofen 600 MG tablet Commonly known as:  ADVIL,MOTRIN You can continue to use these at home for your post op pain.  If you run out you can get the over the counter Ibuprofen and that is 200 mg.  You can take 2-3 tablets of this lower dose every 6 hours as needed.  You can alternate this with the plain Tylenol also.  You do not need a prescription for this and can buy at any drug store. What changed:    how much to take  how to take this  when to take this  additional instructions   oxyCODONE 5 MG immediate release tablet Commonly known as:  Oxy IR/ROXICODONE Take 1 tablet (5 mg total) by mouth every 4 (four) hours as needed for severe pain or breakthrough pain.   prenatal multivitamin Tabs tablet Take 1 tablet by mouth daily at 12 noon.      Follow-up Information    [facility], [place] Follow up on 06/14/2018.   Specialty:  General Surgery Why:  your appointment is on 8:30 AM.  Be at the office 30 minutes early for check in.  Bring photo ID and insurance  information. Contact information: [address] [place] [address] [phone]        Ronnette Juniper, MD Follow up.   Specialty:  Gastroenterology Why:  If the office does not call you in the next 24 hours, call tomorrow and set up an appointment and blood work with Dr. Therisa Doyne next week.  Contact information: [address] [place] [address] [phone]        Physicians, [facility] Family Follow up.   Specialty:  Family Medicine Why:  Follow up with them and let them know you had surgery, and that you are being followed by Dr. Therisa Doyne.   Contact information: [address]  [place] [address] [phone]           Signed: Earnstine Regal 06/02/2018, 4:17 PM

## 2018-06-09 DIAGNOSIS — R945 Abnormal results of liver function studies: Secondary | ICD-10-CM | POA: Diagnosis not present

## 2018-06-27 DIAGNOSIS — Z30430 Encounter for insertion of intrauterine contraceptive device: Secondary | ICD-10-CM | POA: Diagnosis not present

## 2018-06-27 DIAGNOSIS — Z3202 Encounter for pregnancy test, result negative: Secondary | ICD-10-CM | POA: Diagnosis not present

## 2018-07-11 DIAGNOSIS — R945 Abnormal results of liver function studies: Secondary | ICD-10-CM | POA: Diagnosis not present

## 2018-07-11 NOTE — Addendum Note (Signed)
Addendum  created 07/11/18 1511 by Leilani AbleHatchett, Laxmi Choung, MD   Intraprocedure Staff edited

## 2018-08-09 DIAGNOSIS — Z30431 Encounter for routine checking of intrauterine contraceptive device: Secondary | ICD-10-CM | POA: Diagnosis not present

## 2018-08-14 IMAGING — MR MR 3D RECON AT [facility]NER
19 of 23 series · 19 of 23 positions shown · IV contrast (multihance)
Comparison: Abdominal ultrasound examination 05/31/2018.

CLINICAL DATA: Status post cholecystectomy. Common bile duct stones
suspected.

EXAM:
MRI ABDOMEN WITHOUT AND WITH CONTRAST (INCLUDING MRCP)
TECHNIQUE: Multiplanar multisequence MR imaging of the abdomen was performed
both before and after the administration of intravenous contrast.
Heavily T2-weighted images of the biliary and pancreatic ducts were
obtained, and three-dimensional MRCP images were rendered by post
processing.
CONTRAST:  18mL MULTIHANCE GADOBENATE DIMEGLUMINE 529 MG/ML IV SOLN

[Series 3: T2 fat-sat · axial · 5.0mm · 0.78mm/px · 1 of 62 slices shown]
[im 1/62]
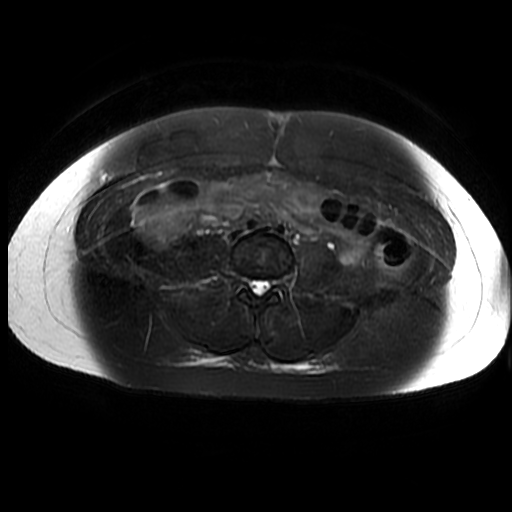

[Series 4: MRCP · coronal · 1.6mm · 0.62mm/px · 1 of 133 slices shown (1 of 2)]
[im 1/133]
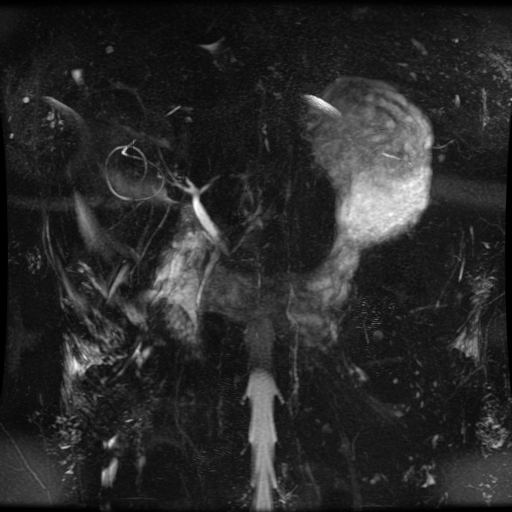

[Series 6: bSSFP fat-sat · coronal · 5.0mm · 0.70mm/px · 1 of 40 slices shown]
[im 1/40]
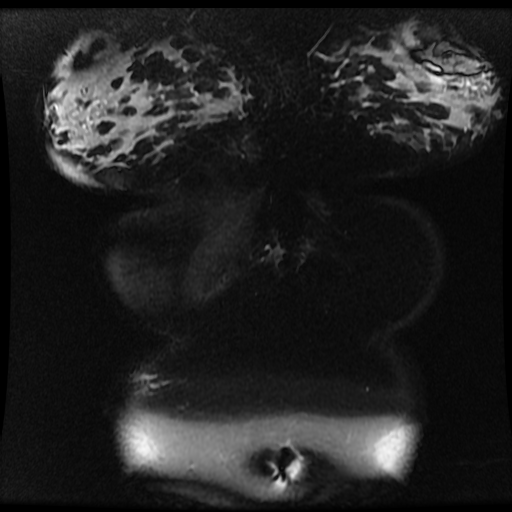

[Series 7: T2 · axial · 5.0mm · 0.78mm/px · 1 of 62 slices shown]
[im 1/62]
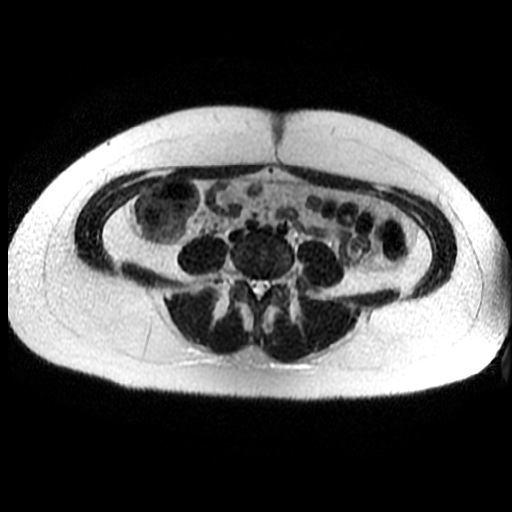

[Series 8: DWI b500 · axial · 6.0mm · 1.48mm/px · 1 of 78 slices shown]
[im 1/78]
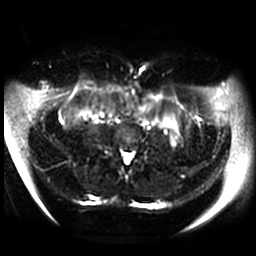

[Series 9: ax dualecho · axial · 5.0mm · 0.78mm/px · 1 of 124 slices shown]
[im 1/124]
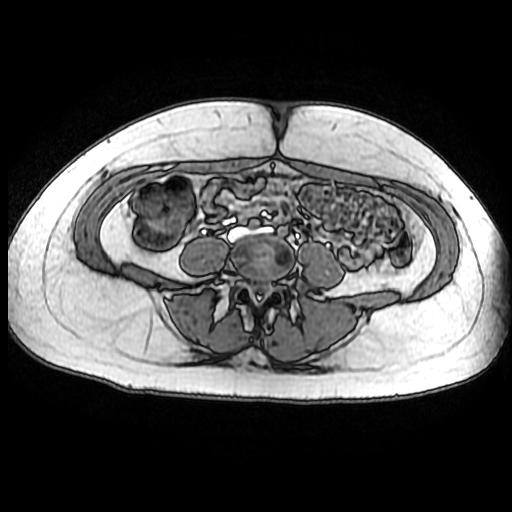

[Series 10: MRCP · coronal · 40.0mm · 0.70mm/px · 1 of 9 slices shown (2 of 2)]
[im 1/9]
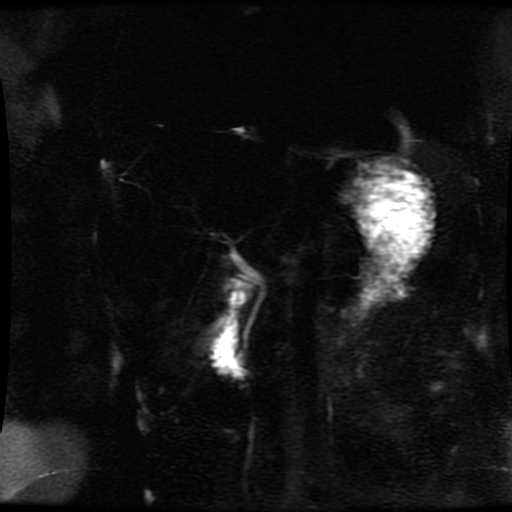

[Series 400: reformatted · axial · 1.6mm · 0.62mm/px · 1 of 145 slices shown (1 of 2)]
[im 1/145]
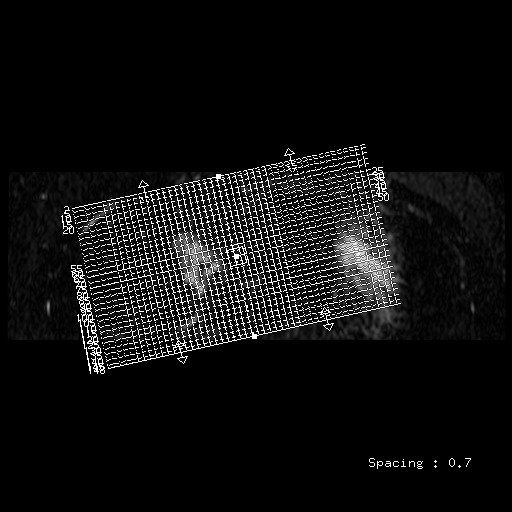

[Series 401: reformatted · axial · 1.6mm · 0.62mm/px · 1 of 152 slices shown (2 of 2)]
[im 1/152]
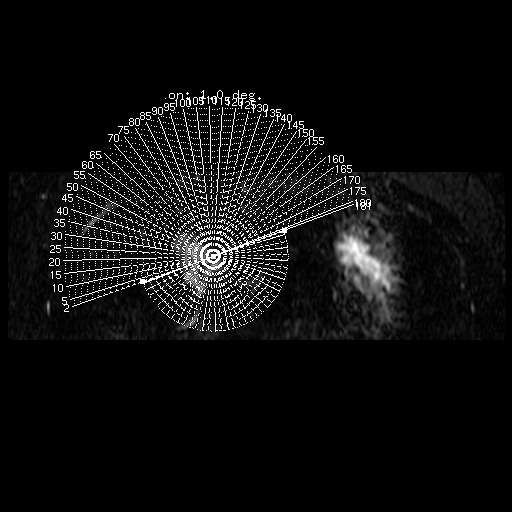

[Series 800: DWI · axial · 6.0mm · 1.48mm/px · 1 of 39 slices shown]
[im 1/39]
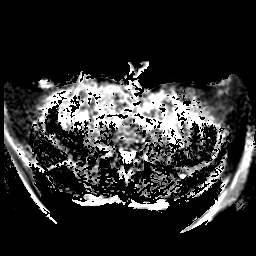

[Series 1100: T1 dynamic · axial · 5.8mm · 0.78mm/px · 1 of 92 slices shown (1 of 5)]
[im 1/92]
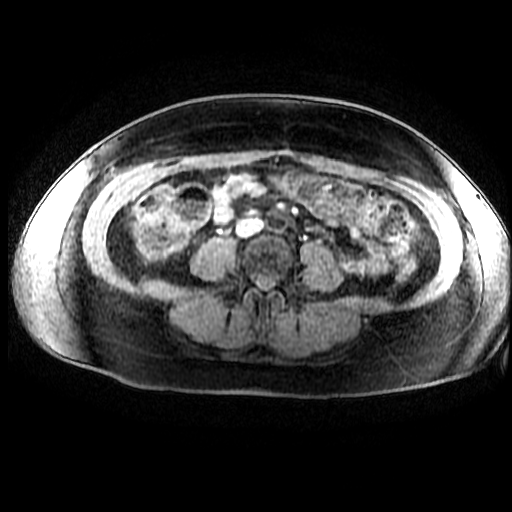

[Series 1101: T1 dynamic · axial · 5.8mm · 0.78mm/px · 1 of 92 slices shown (2 of 5)]
[im 1/92]
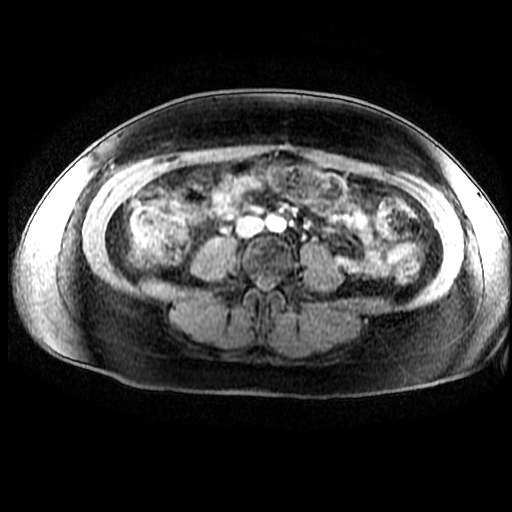

[Series 1103: T1 dynamic · axial · 5.8mm · 0.78mm/px · 1 of 92 slices shown (3 of 5)]
[im 1/92]
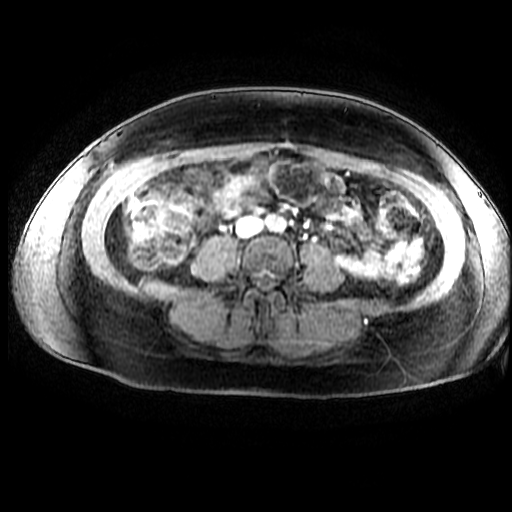

[Series 1104: T1 dynamic · axial · 5.8mm · 0.78mm/px · 1 of 92 slices shown (4 of 5)]
[im 1/92]
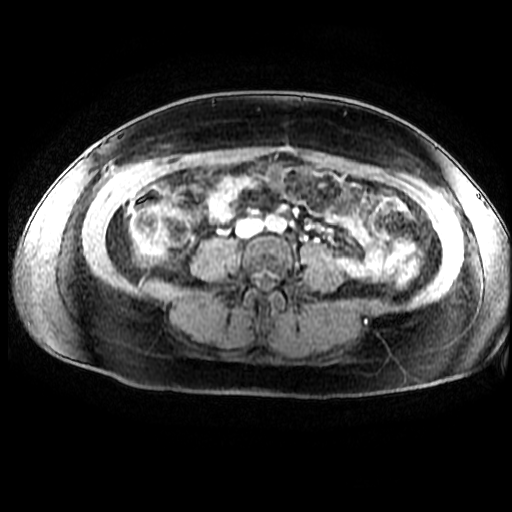

[Series 1105: T1 dynamic · axial · 5.8mm · 0.78mm/px · 1 of 92 slices shown (5 of 5)]
[im 1/92]
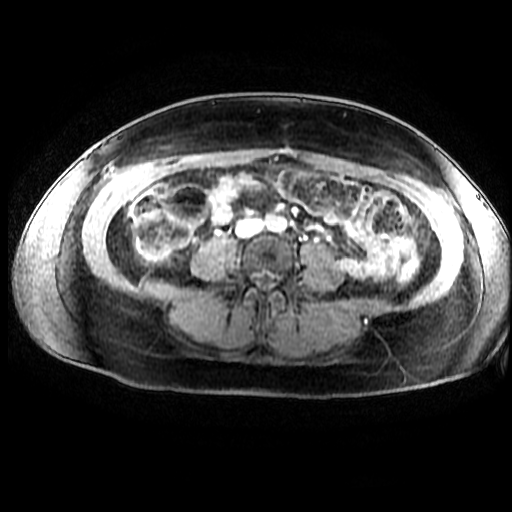

[((id)/(id)/1)-((id)/(id)/1) · axial · 5.8mm · 0.78mm/px · 1 of 92 slices shown (1 of 4)]
[im 1/92]
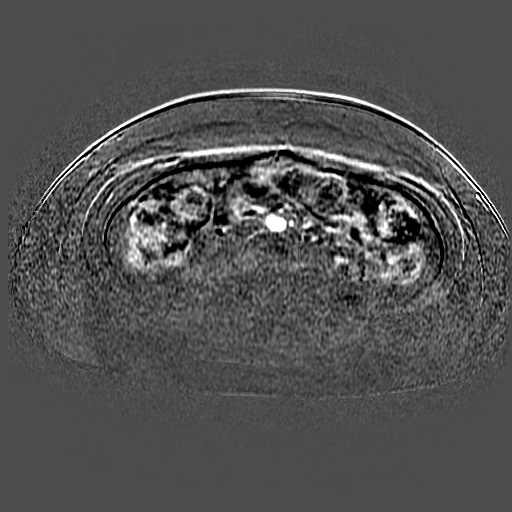

[((id)/(id)/1)-((id)/(id)/1) · axial · 5.8mm · 0.78mm/px · 1 of 92 slices shown (2 of 4)]
[im 1/92]
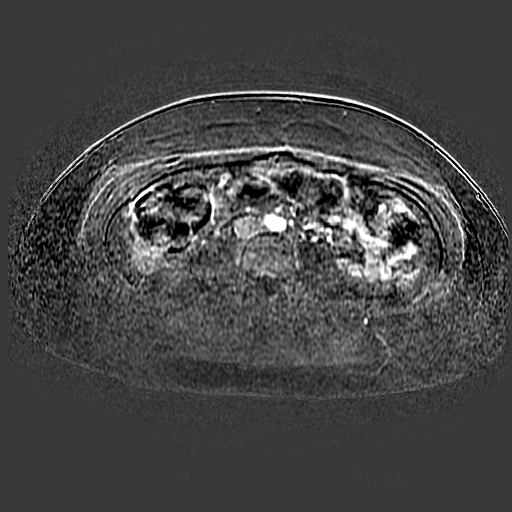

[((id)/(id)/1)-((id)/(id)/1) · axial · 5.8mm · 0.78mm/px · 1 of 92 slices shown (3 of 4)]
[im 1/92]
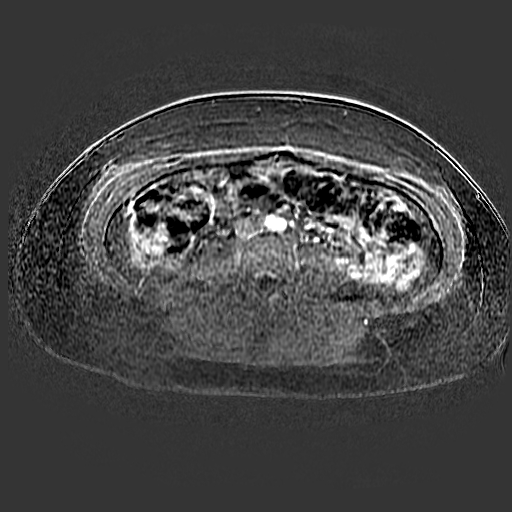

[((id)/(id)/1)-((id)/(id)/1) · axial · 5.8mm · 0.78mm/px · 1 of 92 slices shown (4 of 4)]
[im 1/92]
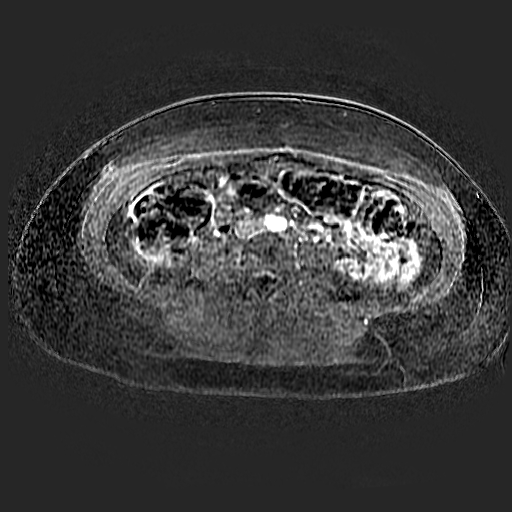

[19 of 23 positions shown; findings below may reference images not displayed]

FINDINGS: Lower chest: Minimal streaky bibasilar atelectasis. No pleural or
pericardial effusion. Marked increased T2 signal intensity in the
breast tissue most likely due to lactation.

Hepatobiliary: No focal hepatic lesions or intrahepatic biliary
dilatation. There is expected postoperative changes in the
gallbladder fossa with some residual fluid and possible small amount
of hemorrhage. The common bile duct demonstrates normal caliber and
course. No common bile duct stones are identified.

Pancreas:  No mass, inflammation or ductal dilatation.

Spleen:  Normal size.  No focal lesions.

Adrenals/Urinary Tract: The adrenal glands and kidneys are
unremarkable.

Stomach/Bowel: Visualized portions within the abdomen are
unremarkable.

Vascular/Lymphatic: No pathologically enlarged lymph nodes
identified. No abdominal aortic aneurysm demonstrated.

Other: Expected postoperative fluid around the second portion the
duodenum and a small amount of fluid in the hepatorenal space.

Musculoskeletal: No significant bony findings.
IMPRESSION: 1. Expected postoperative changes from recent cholecystectomy with a
small amount of residual fluid in the gallbladder fossa, surrounding
the second portion the duodenum and in the anterior pararenal space.
2. No intra or extrahepatic biliary dilatation. Normal caliber and
course of the common bile duct without evidence of common bile duct
stones.
3. Normal pancreas.

## 2018-09-19 DIAGNOSIS — R945 Abnormal results of liver function studies: Secondary | ICD-10-CM | POA: Diagnosis not present

## 2019-01-02 DIAGNOSIS — M25561 Pain in right knee: Secondary | ICD-10-CM | POA: Diagnosis not present

## 2019-01-06 DIAGNOSIS — M25561 Pain in right knee: Secondary | ICD-10-CM | POA: Diagnosis not present

## 2019-01-09 DIAGNOSIS — M25561 Pain in right knee: Secondary | ICD-10-CM | POA: Diagnosis not present

## 2019-01-09 DIAGNOSIS — S83271A Complex tear of lateral meniscus, current injury, right knee, initial encounter: Secondary | ICD-10-CM | POA: Diagnosis not present

## 2019-01-30 DIAGNOSIS — S83271A Complex tear of lateral meniscus, current injury, right knee, initial encounter: Secondary | ICD-10-CM | POA: Diagnosis not present

## 2019-01-31 DIAGNOSIS — M23351 Other meniscus derangements, posterior horn of lateral meniscus, right knee: Secondary | ICD-10-CM | POA: Diagnosis not present

## 2019-01-31 DIAGNOSIS — M659 Synovitis and tenosynovitis, unspecified: Secondary | ICD-10-CM | POA: Diagnosis not present

## 2019-01-31 DIAGNOSIS — M65861 Other synovitis and tenosynovitis, right lower leg: Secondary | ICD-10-CM | POA: Diagnosis not present

## 2019-01-31 DIAGNOSIS — M6588 Other synovitis and tenosynovitis, other site: Secondary | ICD-10-CM | POA: Diagnosis not present

## 2019-01-31 DIAGNOSIS — S83271A Complex tear of lateral meniscus, current injury, right knee, initial encounter: Secondary | ICD-10-CM | POA: Diagnosis not present

## 2019-02-03 DIAGNOSIS — M25561 Pain in right knee: Secondary | ICD-10-CM | POA: Diagnosis not present

## 2019-02-03 DIAGNOSIS — R2689 Other abnormalities of gait and mobility: Secondary | ICD-10-CM | POA: Diagnosis not present

## 2019-02-06 DIAGNOSIS — R2689 Other abnormalities of gait and mobility: Secondary | ICD-10-CM | POA: Diagnosis not present

## 2019-02-06 DIAGNOSIS — M25561 Pain in right knee: Secondary | ICD-10-CM | POA: Diagnosis not present

## 2019-02-08 DIAGNOSIS — R2689 Other abnormalities of gait and mobility: Secondary | ICD-10-CM | POA: Diagnosis not present

## 2019-02-08 DIAGNOSIS — M25561 Pain in right knee: Secondary | ICD-10-CM | POA: Diagnosis not present

## 2019-02-14 DIAGNOSIS — R2689 Other abnormalities of gait and mobility: Secondary | ICD-10-CM | POA: Diagnosis not present

## 2019-02-14 DIAGNOSIS — M25561 Pain in right knee: Secondary | ICD-10-CM | POA: Diagnosis not present

## 2019-06-06 DIAGNOSIS — Z03818 Encounter for observation for suspected exposure to other biological agents ruled out: Secondary | ICD-10-CM | POA: Diagnosis not present

## 2019-08-07 DIAGNOSIS — Z309 Encounter for contraceptive management, unspecified: Secondary | ICD-10-CM | POA: Diagnosis not present

## 2019-08-07 DIAGNOSIS — Z30432 Encounter for removal of intrauterine contraceptive device: Secondary | ICD-10-CM | POA: Diagnosis not present

## 2019-09-12 DIAGNOSIS — Z6830 Body mass index (BMI) 30.0-30.9, adult: Secondary | ICD-10-CM | POA: Diagnosis not present

## 2019-09-12 DIAGNOSIS — Z01419 Encounter for gynecological examination (general) (routine) without abnormal findings: Secondary | ICD-10-CM | POA: Diagnosis not present

## 2019-12-19 DIAGNOSIS — N911 Secondary amenorrhea: Secondary | ICD-10-CM | POA: Diagnosis not present

## 2019-12-26 DIAGNOSIS — Z3481 Encounter for supervision of other normal pregnancy, first trimester: Secondary | ICD-10-CM | POA: Diagnosis not present

## 2019-12-26 DIAGNOSIS — Z3685 Encounter for antenatal screening for Streptococcus B: Secondary | ICD-10-CM | POA: Diagnosis not present

## 2019-12-26 LAB — OB RESULTS CONSOLE GC/CHLAMYDIA
Chlamydia: NEGATIVE
Gonorrhea: NEGATIVE

## 2019-12-26 LAB — OB RESULTS CONSOLE HIV ANTIBODY (ROUTINE TESTING): HIV: NONREACTIVE

## 2019-12-26 LAB — OB RESULTS CONSOLE RPR: RPR: NONREACTIVE

## 2019-12-26 LAB — OB RESULTS CONSOLE RUBELLA ANTIBODY, IGM: Rubella: IMMUNE

## 2019-12-26 LAB — OB RESULTS CONSOLE ABO/RH: RH Type: POSITIVE

## 2019-12-26 LAB — OB RESULTS CONSOLE HEPATITIS B SURFACE ANTIGEN: Hepatitis B Surface Ag: NEGATIVE

## 2019-12-26 LAB — OB RESULTS CONSOLE ANTIBODY SCREEN: Antibody Screen: NEGATIVE

## 2020-01-01 DIAGNOSIS — Z3A10 10 weeks gestation of pregnancy: Secondary | ICD-10-CM | POA: Diagnosis not present

## 2020-01-01 DIAGNOSIS — Z124 Encounter for screening for malignant neoplasm of cervix: Secondary | ICD-10-CM | POA: Diagnosis not present

## 2020-01-01 DIAGNOSIS — Z3400 Encounter for supervision of normal first pregnancy, unspecified trimester: Secondary | ICD-10-CM | POA: Diagnosis not present

## 2020-01-01 DIAGNOSIS — Z113 Encounter for screening for infections with a predominantly sexual mode of transmission: Secondary | ICD-10-CM | POA: Diagnosis not present

## 2020-01-01 DIAGNOSIS — O26891 Other specified pregnancy related conditions, first trimester: Secondary | ICD-10-CM | POA: Diagnosis not present

## 2020-01-15 DIAGNOSIS — Z3682 Encounter for antenatal screening for nuchal translucency: Secondary | ICD-10-CM | POA: Diagnosis not present

## 2020-01-15 DIAGNOSIS — Z36 Encounter for antenatal screening for chromosomal anomalies: Secondary | ICD-10-CM | POA: Diagnosis not present

## 2020-01-15 DIAGNOSIS — Z3A12 12 weeks gestation of pregnancy: Secondary | ICD-10-CM | POA: Diagnosis not present

## 2020-02-26 DIAGNOSIS — Z36 Encounter for antenatal screening for chromosomal anomalies: Secondary | ICD-10-CM | POA: Diagnosis not present

## 2020-02-26 DIAGNOSIS — O99212 Obesity complicating pregnancy, second trimester: Secondary | ICD-10-CM | POA: Diagnosis not present

## 2020-02-26 DIAGNOSIS — E669 Obesity, unspecified: Secondary | ICD-10-CM | POA: Diagnosis not present

## 2020-02-26 DIAGNOSIS — Z3A18 18 weeks gestation of pregnancy: Secondary | ICD-10-CM | POA: Diagnosis not present

## 2020-02-26 DIAGNOSIS — O358XX0 Maternal care for other (suspected) fetal abnormality and damage, not applicable or unspecified: Secondary | ICD-10-CM | POA: Diagnosis not present

## 2020-02-27 DIAGNOSIS — Z3480 Encounter for supervision of other normal pregnancy, unspecified trimester: Secondary | ICD-10-CM | POA: Diagnosis not present

## 2020-03-25 DIAGNOSIS — O358XX0 Maternal care for other (suspected) fetal abnormality and damage, not applicable or unspecified: Secondary | ICD-10-CM | POA: Diagnosis not present

## 2020-03-25 DIAGNOSIS — E669 Obesity, unspecified: Secondary | ICD-10-CM | POA: Diagnosis not present

## 2020-03-25 DIAGNOSIS — O99212 Obesity complicating pregnancy, second trimester: Secondary | ICD-10-CM | POA: Diagnosis not present

## 2020-03-25 DIAGNOSIS — Z3689 Encounter for other specified antenatal screening: Secondary | ICD-10-CM | POA: Diagnosis not present

## 2020-03-26 DIAGNOSIS — Z361 Encounter for antenatal screening for raised alphafetoprotein level: Secondary | ICD-10-CM | POA: Diagnosis not present

## 2020-04-25 DIAGNOSIS — Z3480 Encounter for supervision of other normal pregnancy, unspecified trimester: Secondary | ICD-10-CM | POA: Diagnosis not present

## 2020-05-01 DIAGNOSIS — O99810 Abnormal glucose complicating pregnancy: Secondary | ICD-10-CM | POA: Diagnosis not present

## 2020-06-19 DIAGNOSIS — Z3480 Encounter for supervision of other normal pregnancy, unspecified trimester: Secondary | ICD-10-CM | POA: Diagnosis not present

## 2020-06-19 DIAGNOSIS — Z3685 Encounter for antenatal screening for Streptococcus B: Secondary | ICD-10-CM | POA: Diagnosis not present

## 2020-06-19 LAB — OB RESULTS CONSOLE GBS: GBS: NEGATIVE

## 2020-07-10 ENCOUNTER — Encounter (HOSPITAL_COMMUNITY)
Admission: AD | Disposition: A | Discharge status: Home / Self Care | Payer: Self-pay | Source: Home / Self Care | Attending: Obstetrics and Gynecology

## 2020-07-10 ENCOUNTER — Inpatient Hospital Stay (HOSPITAL_COMMUNITY): Payer: BC Managed Care – PPO | Admitting: Anesthesiology

## 2020-07-10 ENCOUNTER — Inpatient Hospital Stay (HOSPITAL_COMMUNITY)
Admission: AD | Admit: 2020-07-10 | Discharge: 2020-07-12 | DRG: 788 | Disposition: A | Discharge status: Home / Self Care | Payer: BC Managed Care – PPO | Attending: Obstetrics and Gynecology | Admitting: Obstetrics and Gynecology

## 2020-07-10 ENCOUNTER — Encounter (HOSPITAL_COMMUNITY): Payer: Self-pay | Admitting: *Deleted

## 2020-07-10 ENCOUNTER — Other Ambulatory Visit: Payer: Self-pay

## 2020-07-10 ENCOUNTER — Encounter (HOSPITAL_COMMUNITY): Payer: Self-pay | Admitting: Obstetrics and Gynecology

## 2020-07-10 DIAGNOSIS — O34211 Maternal care for low transverse scar from previous cesarean delivery: Principal | ICD-10-CM | POA: Diagnosis present

## 2020-07-10 DIAGNOSIS — O3429 Maternal care due to uterine scar from other previous surgery: Secondary | ICD-10-CM | POA: Diagnosis not present

## 2020-07-10 DIAGNOSIS — Z3A38 38 weeks gestation of pregnancy: Secondary | ICD-10-CM | POA: Diagnosis not present

## 2020-07-10 DIAGNOSIS — Z98891 History of uterine scar from previous surgery: Secondary | ICD-10-CM

## 2020-07-10 DIAGNOSIS — O4292 Full-term premature rupture of membranes, unspecified as to length of time between rupture and onset of labor: Secondary | ICD-10-CM | POA: Diagnosis not present

## 2020-07-10 DIAGNOSIS — Z20822 Contact with and (suspected) exposure to covid-19: Secondary | ICD-10-CM | POA: Diagnosis present

## 2020-07-10 DIAGNOSIS — Z3689 Encounter for other specified antenatal screening: Secondary | ICD-10-CM

## 2020-07-10 DIAGNOSIS — Z3A00 Weeks of gestation of pregnancy not specified: Secondary | ICD-10-CM | POA: Diagnosis not present

## 2020-07-10 LAB — CBC WITH DIFFERENTIAL/PLATELET
Abs Immature Granulocytes: 0.06 10*3/uL (ref 0.00–0.07)
Basophils Absolute: 0 10*3/uL (ref 0.0–0.1)
Basophils Relative: 0 %
Eosinophils Absolute: 0 10*3/uL (ref 0.0–0.5)
Eosinophils Relative: 0 %
HCT: 35.1 % — ABNORMAL LOW (ref 36.0–46.0)
Hemoglobin: 11.3 g/dL — ABNORMAL LOW (ref 12.0–15.0)
Immature Granulocytes: 1 %
Lymphocytes Relative: 25 %
Lymphs Abs: 2.4 10*3/uL (ref 0.7–4.0)
MCH: 27.6 pg (ref 26.0–34.0)
MCHC: 32.2 g/dL (ref 30.0–36.0)
MCV: 85.8 fL (ref 80.0–100.0)
Monocytes Absolute: 0.6 10*3/uL (ref 0.1–1.0)
Monocytes Relative: 6 %
Neutro Abs: 6.6 10*3/uL (ref 1.7–7.7)
Neutrophils Relative %: 68 %
Platelets: 299 10*3/uL (ref 150–400)
RBC: 4.09 MIL/uL (ref 3.87–5.11)
RDW: 13 % (ref 11.5–15.5)
WBC: 9.8 10*3/uL (ref 4.0–10.5)
nRBC: 0 % (ref 0.0–0.2)

## 2020-07-10 LAB — SARS CORONAVIRUS 2 BY RT PCR (HOSPITAL ORDER, PERFORMED IN ~~LOC~~ HOSPITAL LAB): SARS Coronavirus 2: NEGATIVE

## 2020-07-10 LAB — POCT FERN TEST: POCT Fern Test: NEGATIVE

## 2020-07-10 LAB — TYPE AND SCREEN
ABO/RH(D): O POS
Antibody Screen: NEGATIVE

## 2020-07-10 LAB — AMNISURE RUPTURE OF MEMBRANE (ROM) NOT AT ARMC: Amnisure ROM: POSITIVE

## 2020-07-10 SURGERY — Surgical Case
Anesthesia: Spinal | Wound class: Clean Contaminated

## 2020-07-10 MED ORDER — PHENYLEPHRINE HCL-NACL 20-0.9 MG/250ML-% IV SOLN
INTRAVENOUS | Status: AC
  Filled 2020-07-10: qty 250

## 2020-07-10 MED ORDER — TRAMADOL HCL 50 MG PO TABS: 50.0000 mg | ORAL_TABLET | Freq: Four times a day (QID) | ORAL | Status: DC | PRN

## 2020-07-10 MED ORDER — SIMETHICONE 80 MG PO CHEW
80.0000 mg | CHEWABLE_TABLET | Freq: Three times a day (TID) | ORAL | Status: DC
  Administered 2020-07-11 – 2020-07-12 (×3): 80 mg via ORAL
  Filled 2020-07-10 (×4): qty 1

## 2020-07-10 MED ORDER — MORPHINE SULFATE (PF) 0.5 MG/ML IJ SOLN
INTRAMUSCULAR | Status: DC | PRN
  Administered 2020-07-10: .15 mg via INTRATHECAL

## 2020-07-10 MED ORDER — MENTHOL 3 MG MT LOZG: 1.0000 | LOZENGE | OROMUCOSAL | Status: DC | PRN

## 2020-07-10 MED ORDER — OXYTOCIN-SODIUM CHLORIDE 30-0.9 UT/500ML-% IV SOLN: 2.5000 [IU]/h | INTRAVENOUS | Status: AC

## 2020-07-10 MED ORDER — OXYTOCIN-SODIUM CHLORIDE 30-0.9 UT/500ML-% IV SOLN
INTRAVENOUS | Status: AC
  Filled 2020-07-10: qty 500

## 2020-07-10 MED ORDER — NALBUPHINE HCL 10 MG/ML IJ SOLN: 5.0000 mg | Freq: Once | INTRAMUSCULAR | Status: DC | PRN

## 2020-07-10 MED ORDER — POVIDONE-IODINE 10 % EX SWAB
2.0000 "application " | Freq: Once | CUTANEOUS | Status: AC
  Administered 2020-07-10: 2 via TOPICAL

## 2020-07-10 MED ORDER — SOD CITRATE-CITRIC ACID 500-334 MG/5ML PO SOLN
30.0000 mL | Freq: Once | ORAL | Status: AC
  Administered 2020-07-10: 30 mL via ORAL
  Filled 2020-07-10: qty 30

## 2020-07-10 MED ORDER — LACTATED RINGERS IV SOLN: INTRAVENOUS | Status: DC

## 2020-07-10 MED ORDER — NALBUPHINE HCL 10 MG/ML IJ SOLN: 5.0000 mg | INTRAMUSCULAR | Status: DC | PRN

## 2020-07-10 MED ORDER — KETOROLAC TROMETHAMINE 30 MG/ML IJ SOLN
INTRAMUSCULAR | Status: AC
  Filled 2020-07-10: qty 1

## 2020-07-10 MED ORDER — ONDANSETRON HCL 4 MG/2ML IJ SOLN: 4.0000 mg | Freq: Three times a day (TID) | INTRAMUSCULAR | Status: DC | PRN

## 2020-07-10 MED ORDER — DIPHENHYDRAMINE HCL 25 MG PO CAPS: 25.0000 mg | ORAL_CAPSULE | Freq: Four times a day (QID) | ORAL | Status: DC | PRN

## 2020-07-10 MED ORDER — OXYTOCIN-SODIUM CHLORIDE 30-0.9 UT/500ML-% IV SOLN
INTRAVENOUS | Status: DC | PRN
  Administered 2020-07-10 (×2): 30 [IU] via INTRAVENOUS

## 2020-07-10 MED ORDER — IBUPROFEN 800 MG PO TABS
800.0000 mg | ORAL_TABLET | Freq: Three times a day (TID) | ORAL | Status: DC
  Administered 2020-07-11 – 2020-07-12 (×4): 800 mg via ORAL
  Filled 2020-07-10 (×4): qty 1

## 2020-07-10 MED ORDER — BUPIVACAINE IN DEXTROSE 0.75-8.25 % IT SOLN
INTRATHECAL | Status: DC | PRN
  Administered 2020-07-10: 1.7 mL via INTRATHECAL

## 2020-07-10 MED ORDER — ONDANSETRON HCL 4 MG/2ML IJ SOLN
INTRAMUSCULAR | Status: DC | PRN
  Administered 2020-07-10: 4 mg via INTRAVENOUS

## 2020-07-10 MED ORDER — OXYCODONE HCL 5 MG PO TABS
5.0000 mg | ORAL_TABLET | ORAL | Status: DC | PRN
  Administered 2020-07-12 (×2): 5 mg via ORAL
  Filled 2020-07-10 (×2): qty 1

## 2020-07-10 MED ORDER — DIPHENHYDRAMINE HCL 25 MG PO CAPS
25.0000 mg | ORAL_CAPSULE | ORAL | Status: DC | PRN
  Administered 2020-07-11: 25 mg via ORAL
  Filled 2020-07-10: qty 1

## 2020-07-10 MED ORDER — SIMETHICONE 80 MG PO CHEW
80.0000 mg | CHEWABLE_TABLET | ORAL | Status: DC
  Administered 2020-07-10 – 2020-07-12 (×2): 80 mg via ORAL
  Filled 2020-07-10 (×2): qty 1

## 2020-07-10 MED ORDER — ZOLPIDEM TARTRATE 5 MG PO TABS: 5.0000 mg | ORAL_TABLET | Freq: Every evening | ORAL | Status: DC | PRN

## 2020-07-10 MED ORDER — DIPHENHYDRAMINE HCL 50 MG/ML IJ SOLN: 12.5000 mg | INTRAMUSCULAR | Status: DC | PRN

## 2020-07-10 MED ORDER — FAMOTIDINE IN NACL 20-0.9 MG/50ML-% IV SOLN
20.0000 mg | Freq: Once | INTRAVENOUS | Status: AC
  Administered 2020-07-10: 20 mg via INTRAVENOUS
  Filled 2020-07-10: qty 50

## 2020-07-10 MED ORDER — KETOROLAC TROMETHAMINE 30 MG/ML IJ SOLN: 30.0000 mg | Freq: Once | INTRAMUSCULAR | Status: DC

## 2020-07-10 MED ORDER — PHENYLEPHRINE HCL (PRESSORS) 10 MG/ML IV SOLN
INTRAVENOUS | Status: DC | PRN
  Administered 2020-07-10 (×2): 40 ug via INTRAVENOUS

## 2020-07-10 MED ORDER — TETANUS-DIPHTH-ACELL PERTUSSIS 5-2.5-18.5 LF-MCG/0.5 IM SUSP: 0.5000 mL | Freq: Once | INTRAMUSCULAR | Status: DC

## 2020-07-10 MED ORDER — PROMETHAZINE HCL 25 MG/ML IJ SOLN: 6.2500 mg | INTRAMUSCULAR | Status: DC | PRN

## 2020-07-10 MED ORDER — CEFAZOLIN SODIUM-DEXTROSE 2-4 GM/100ML-% IV SOLN
2.0000 g | INTRAVENOUS | Status: AC
  Administered 2020-07-10: 2 g via INTRAVENOUS
  Filled 2020-07-10: qty 100

## 2020-07-10 MED ORDER — COCONUT OIL OIL: 1.0000 "application " | TOPICAL_OIL | Status: DC | PRN

## 2020-07-10 MED ORDER — KETOROLAC TROMETHAMINE 30 MG/ML IJ SOLN: 30.0000 mg | Freq: Four times a day (QID) | INTRAMUSCULAR | Status: AC | PRN

## 2020-07-10 MED ORDER — WITCH HAZEL-GLYCERIN EX PADS: 1.0000 "application " | MEDICATED_PAD | CUTANEOUS | Status: DC | PRN

## 2020-07-10 MED ORDER — KETOROLAC TROMETHAMINE 30 MG/ML IJ SOLN
30.0000 mg | Freq: Four times a day (QID) | INTRAMUSCULAR | Status: AC | PRN
  Administered 2020-07-10: 30 mg via INTRAVENOUS

## 2020-07-10 MED ORDER — NALOXONE HCL 4 MG/10ML IJ SOLN
1.0000 ug/kg/h | INTRAVENOUS | Status: DC | PRN
  Filled 2020-07-10: qty 5

## 2020-07-10 MED ORDER — MORPHINE SULFATE (PF) 0.5 MG/ML IJ SOLN
INTRAMUSCULAR | Status: AC
  Filled 2020-07-10: qty 10

## 2020-07-10 MED ORDER — ACETAMINOPHEN 500 MG PO TABS
1000.0000 mg | ORAL_TABLET | Freq: Four times a day (QID) | ORAL | Status: AC
  Administered 2020-07-10 – 2020-07-11 (×3): 1000 mg via ORAL
  Filled 2020-07-10 (×4): qty 2

## 2020-07-10 MED ORDER — PRENATAL MULTIVITAMIN CH
1.0000 | ORAL_TABLET | Freq: Every day | ORAL | Status: DC
  Administered 2020-07-11 – 2020-07-12 (×2): 1 via ORAL
  Filled 2020-07-10 (×2): qty 1

## 2020-07-10 MED ORDER — SIMETHICONE 80 MG PO CHEW: 80.0000 mg | CHEWABLE_TABLET | ORAL | Status: DC | PRN

## 2020-07-10 MED ORDER — ONDANSETRON HCL 4 MG/2ML IJ SOLN
INTRAMUSCULAR | Status: AC
  Filled 2020-07-10: qty 2

## 2020-07-10 MED ORDER — SENNOSIDES-DOCUSATE SODIUM 8.6-50 MG PO TABS
2.0000 | ORAL_TABLET | ORAL | Status: DC
  Administered 2020-07-10 – 2020-07-12 (×2): 2 via ORAL
  Filled 2020-07-10 (×2): qty 2

## 2020-07-10 MED ORDER — PHENYLEPHRINE HCL-NACL 20-0.9 MG/250ML-% IV SOLN
INTRAVENOUS | Status: DC | PRN
  Administered 2020-07-10: 60 ug/min via INTRAVENOUS

## 2020-07-10 MED ORDER — STERILE WATER FOR IRRIGATION IR SOLN
Status: DC | PRN
  Administered 2020-07-10 (×2): 1000 mL

## 2020-07-10 MED ORDER — DIBUCAINE (PERIANAL) 1 % EX OINT: 1.0000 "application " | TOPICAL_OINTMENT | CUTANEOUS | Status: DC | PRN

## 2020-07-10 MED ORDER — FENTANYL CITRATE (PF) 100 MCG/2ML IJ SOLN
INTRAMUSCULAR | Status: DC | PRN
  Administered 2020-07-10: 15 ug via INTRATHECAL

## 2020-07-10 MED ORDER — SODIUM CHLORIDE 0.9% FLUSH: 3.0000 mL | INTRAVENOUS | Status: DC | PRN

## 2020-07-10 MED ORDER — NALOXONE HCL 0.4 MG/ML IJ SOLN: 0.4000 mg | INTRAMUSCULAR | Status: DC | PRN

## 2020-07-10 MED ORDER — FENTANYL CITRATE (PF) 100 MCG/2ML IJ SOLN: 25.0000 ug | INTRAMUSCULAR | Status: DC | PRN

## 2020-07-10 MED ORDER — FENTANYL CITRATE (PF) 100 MCG/2ML IJ SOLN
INTRAMUSCULAR | Status: AC
  Filled 2020-07-10: qty 2

## 2020-07-10 MED ORDER — ACETAMINOPHEN 160 MG/5ML PO SOLN: 1000.0000 mg | Freq: Once | ORAL | Status: DC

## 2020-07-10 MED ORDER — ACETAMINOPHEN 500 MG PO TABS: 1000.0000 mg | ORAL_TABLET | Freq: Once | ORAL | Status: DC

## 2020-07-10 SURGICAL SUPPLY — 33 items
BENZOIN TINCTURE PRP APPL 2/3 (GAUZE/BANDAGES/DRESSINGS) ×3 IMPLANT
CHLORAPREP W/TINT 26ML (MISCELLANEOUS) ×3 IMPLANT
CLAMP CORD UMBIL (MISCELLANEOUS) IMPLANT
CLOSURE WOUND 1/2 X4 (GAUZE/BANDAGES/DRESSINGS) ×1
CLOTH BEACON ORANGE TIMEOUT ST (SAFETY) ×3 IMPLANT
DERMABOND ADVANCED (GAUZE/BANDAGES/DRESSINGS)
DERMABOND ADVANCED .7 DNX12 (GAUZE/BANDAGES/DRESSINGS) IMPLANT
DRSG OPSITE POSTOP 4X10 (GAUZE/BANDAGES/DRESSINGS) ×3 IMPLANT
ELECT REM PT RETURN 9FT ADLT (ELECTROSURGICAL) ×3
ELECTRODE REM PT RTRN 9FT ADLT (ELECTROSURGICAL) ×1 IMPLANT
EXTRACTOR VACUUM KIWI (MISCELLANEOUS) IMPLANT
GLOVE BIO SURGEON STRL SZ 6 (GLOVE) ×3 IMPLANT
GLOVE BIOGEL PI IND STRL 6 (GLOVE) ×2 IMPLANT
GLOVE BIOGEL PI IND STRL 7.0 (GLOVE) ×1 IMPLANT
GLOVE BIOGEL PI INDICATOR 6 (GLOVE) ×4
GLOVE BIOGEL PI INDICATOR 7.0 (GLOVE) ×2
GOWN STRL REUS W/TWL LRG LVL3 (GOWN DISPOSABLE) ×6 IMPLANT
KIT ABG SYR 3ML LUER SLIP (SYRINGE) ×3 IMPLANT
NEEDLE HYPO 25X5/8 SAFETYGLIDE (NEEDLE) ×3 IMPLANT
NS IRRIG 1000ML POUR BTL (IV SOLUTION) ×3 IMPLANT
PACK C SECTION WH (CUSTOM PROCEDURE TRAY) ×3 IMPLANT
PAD OB MATERNITY 4.3X12.25 (PERSONAL CARE ITEMS) ×3 IMPLANT
PENCIL SMOKE EVAC W/HOLSTER (ELECTROSURGICAL) ×3 IMPLANT
STRIP CLOSURE SKIN 1/2X4 (GAUZE/BANDAGES/DRESSINGS) ×2 IMPLANT
SUT CHROMIC 0 CTX 36 (SUTURE) ×9 IMPLANT
SUT MON AB 2-0 CT1 27 (SUTURE) ×3 IMPLANT
SUT PDS AB 0 CT1 27 (SUTURE) IMPLANT
SUT PLAIN 0 NONE (SUTURE) IMPLANT
SUT VIC AB 0 CT1 36 (SUTURE) IMPLANT
SUT VIC AB 4-0 KS 27 (SUTURE) IMPLANT
TOWEL OR 17X24 6PK STRL BLUE (TOWEL DISPOSABLE) ×3 IMPLANT
TRAY FOLEY W/BAG SLVR 14FR LF (SET/KITS/TRAYS/PACK) IMPLANT
WATER STERILE IRR 1000ML POUR (IV SOLUTION) ×3 IMPLANT

## 2020-07-10 NOTE — Anesthesia Preprocedure Evaluation (Signed)
Anesthesia Evaluation  Patient identified by MRN, date of birth, ID band Patient awake    Reviewed: Allergy & Precautions, Patient's Chart, lab work & pertinent test results  History of Anesthesia Complications Negative for: history of anesthetic complications  Airway Mallampati: II  TM Distance: >3 FB Neck ROM: Full    Dental no notable dental hx.    Pulmonary neg pulmonary ROS,    Pulmonary exam normal        Cardiovascular negative cardio ROS Normal cardiovascular exam     Neuro/Psych negative neurological ROS  negative psych ROS   GI/Hepatic negative GI ROS, Neg liver ROS,   Endo/Other  negative endocrine ROS  Renal/GU negative Renal ROS  negative genitourinary   Musculoskeletal negative musculoskeletal ROS (+)   Abdominal   Peds  Hematology negative hematology ROS (+)   Anesthesia Other Findings Day of surgery medications reviewed with patient.  Reproductive/Obstetrics (+) Pregnancy (Hx of C/S x1)                             Anesthesia Physical Anesthesia Plan  ASA: III  Anesthesia Plan: Spinal   Post-op Pain Management:    Induction:   PONV Risk Score and Plan: 4 or greater and Treatment may vary due to age or medical condition, Ondansetron and Dexamethasone  Airway Management Planned: Natural Airway  Additional Equipment: None  Intra-op Plan:   Post-operative Plan:   Informed Consent: I have reviewed the patients History and Physical, chart, labs and discussed the procedure including the risks, benefits and alternatives for the proposed anesthesia with the patient or authorized representative who has indicated his/her understanding and acceptance.       Plan Discussed with: CRNA  Anesthesia Plan Comments:         Anesthesia Quick Evaluation

## 2020-07-10 NOTE — MAU Provider Note (Signed)
S: Ms. Meghan Mills is a 29 y.o. G2P1001 at [redacted]w[redacted]d  who presents to MAU today complaining of leaking of fluid since 0730AM. Patient reports she woke up this morning to use the bathroom and felt some fluid come out when she was on her way there. When she reached the toilet, she reports not much urine came out. Patient reports she saw the fluid in her underwear and it was clear with some discharge. Patient reports fluid was odorless. Patient denies any other instances since 0730AM this morning. Patient denies wearing a pad this morning. She denies vaginal bleeding. She endorses contractions, but not at this time. She reports normal fetal movement.    O: BP 117/74 (BP Location: Left Arm)   Pulse 79   Temp 98 F (36.7 C) (Oral)   Resp 20   Wt (!) 103.4 kg   SpO2 99%   BMI 33.68 kg/m    Patient Vitals for the past 24 hrs:  BP Temp Temp src Pulse Resp SpO2 Weight  07/10/20 1307 117/74 98 F (36.7 C) Oral 79 20 99 % --  07/10/20 1300 -- -- -- -- -- -- (!) 103.4 kg   GENERAL: Well-developed, well-nourished female in no acute distress.  HEAD: Normocephalic, atraumatic.  CHEST: Normal effort of breathing, regular heart rate ABDOMEN: Soft, nontender, gravid PELVIC: Not performed, given lack of fluid since 0730AM, opted for AmniSure for more accurate results  Cervical exam: not performed, patient is repeat C/S    Fetal Monitoring: reactive Baseline: 130 Variability: moderate Accelerations: present, 15x15 Decelerations: absent Contractions: few ctx, pt not feeling  Results for orders placed or performed during the hospital encounter of 07/10/20 (from the past 24 hour(s))  Amnisure rupture of membrane (rom)not at [facility]     Status: None   Collection Time: 07/10/20  1:55 PM  Result Value Ref Range   Amnisure ROM POSITIVE   POCT fern test     Status: None   Collection Time: 07/10/20  2:07 PM  Result Value Ref Range   POCT Fern Test Negative = intact amniotic membranes     A: SIUP at [redacted]w[redacted]d   SROM  P: Report given to RN to contact MD on call for further instructions  Audia Amick, Odie Sera, NP 07/10/2020 2:44 PM

## 2020-07-10 NOTE — MAU Note (Signed)
Pt states that about 0730 she got up to go pee and before she made it to the bathroom she felt like she peed on herself. Pt states now she is unsure if that was her water or if she did pee on herself.   Pt denies active leaking, but does feel wet.  Denies vaginal bleeding  Reports +FM

## 2020-07-10 NOTE — Lactation Note (Signed)
This note was copied from a baby's chart. Lactation Consultation Note  Patient Name: Meghan Mills GNOIB'B Date: 07/10/2020  P2, 2 hour ETI female infant. Mom declined LC services at this time she BF her 1st child for 10 months. She will call [facility] services if she changes her mind if she needs BF assistance.    Maternal Data    Feeding Feeding Type: Breast Milk  LATCH Score Latch: Repeated attempts needed to sustain latch, nipple held in mouth throughout feeding, stimulation needed to elicit sucking reflex.  Audible Swallowing: A few with stimulation  Type of Nipple: Everted at rest and after stimulation  Comfort (Breast/Nipple): Soft / non-tender  Hold (Positioning): Assistance needed to correctly position infant at breast and maintain latch.  LATCH Score: 7  Interventions Interventions: Breast feeding basics reviewed;Assisted with latch;Skin to skin;Adjust position  Lactation Tools Discussed/Used     Consult Status      Meghan Mills 07/10/2020, 8:08 PM

## 2020-07-10 NOTE — Patient Instructions (Signed)
Meghan Mills  07/10/2020   Your procedure is scheduled on:  07/22/2020  Arrive at 0530 at Entrance C on [employer] at [facility]  and [employer]. You are invited to use the FREE valet parking or use the Visitor's parking deck.  Pick up the phone at the desk and dial [phone].  Call this number if you have problems the morning of surgery: [phone]  Remember:   Do not eat food:(After Midnight) Desps de medianoche.  Do not drink clear liquids: (After Midnight) Desps de medianoche.  Take these medicines the morning of surgery with A SIP OF WATER:  none   Do not wear jewelry, make-up or nail polish.  Do not wear lotions, powders, or perfumes. Do not wear deodorant.  Do not shave 48 hours prior to surgery.  Do not bring valuables to the hospital.  [facility] is not   responsible for any belongings or valuables brought to the hospital.  Contacts, dentures or bridgework may not be worn into surgery.  Leave suitcase in the car. After surgery it may be brought to your room.  For patients admitted to the hospital, checkout time is 11:00 AM the day of              discharge.      Please read over the following fact sheets that you were given:     Preparing for Surgery

## 2020-07-10 NOTE — H&P (Signed)
Meghan Mills is a 29 y.o.G 2 P 1 at 38 weeks presents with SROM  For Repeat C Section OB History    Gravida  2   Para  1   Term  1   Preterm      AB      Living  1     SAB      TAB      Ectopic      Multiple  0   Live Births  1          History reviewed. No pertinent past medical history. Past Surgical History:  Procedure Laterality Date  . CESAREAN SECTION N/A 04/21/2018   Procedure: CESAREAN SECTION;  Surgeon: Richarda Overlie, MD;  Location: [facility] BIRTHING SUITES;  Service: Obstetrics;  Laterality: N/A;  . CHOLECYSTECTOMY N/A 05/31/2018   Procedure: LAPAROSCOPIC CHOLECYSTECTOMY WITH INTRAOPERATIVE CHOLANGIOGRAM;  Surgeon: Almond Lint, MD;  Location: [facility] ORS;  Service: General;  Laterality: N/A;  . KNEE SURGERY     R knee  . TONSILLECTOMY     Family History: family history is not on file. Social History:  reports that she has never smoked. She has never used smokeless tobacco. She reports that she does not drink alcohol and does not use drugs.     Maternal Diabetes: No Genetic Screening: Normal Maternal Ultrasounds/Referrals: Normal Fetal Ultrasounds or other Referrals:  None Maternal Substance Abuse:  No Significant Maternal Medications:  None Significant Maternal Lab Results:  None Other Comments:  None  Review of Systems  All other systems reviewed and are negative.  Maternal Medical History:  Reason for admission: Rupture of membranes.   Prenatal complications: no prenatal complications     Blood pressure 117/74, pulse 79, temperature 98 F (36.7 C), temperature source Oral, resp. rate 20, height 5\' 9"  (1.753 m), weight (!) 103.4 kg, SpO2 99 %, currently breastfeeding. Maternal Exam:  Abdomen: Surgical scars: low transverse.   Fetal presentation: vertex     Fetal Exam Fetal State Assessment: Category I - tracings are normal.     Physical Exam Vitals and nursing note reviewed.  Constitutional:      Appearance: Normal appearance.   HENT:     Head: Normocephalic.  Eyes:     Pupils: Pupils are equal, round, and reactive to light.  Cardiovascular:     Rate and Rhythm: Normal rate and regular rhythm.  Pulmonary:     Effort: Pulmonary effort is normal.  Neurological:     Mental Status: She is alert.     Prenatal labs: ABO, Rh: --/--/O POS (07/28 1440) Antibody: NEG (07/28 1440) Rubella: Immune (01/12 0000) RPR: Nonreactive (01/12 0000)  HBsAg: Negative (01/12 0000)  HIV: Non-reactive (01/12 0000)  GBS: Negative/-- (07/07 0000)   Assessment/Plan: IUP at 38 weeks SROM Previous C Section For Repeat C Section  07-25-1984 07/10/2020, 5:15 PM

## 2020-07-10 NOTE — Anesthesia Postprocedure Evaluation (Signed)
Anesthesia Post Note  Patient: Meghan Mills  Procedure(s) Performed: CESAREAN SECTION (N/A )     Patient location during evaluation: PACU Anesthesia Type: Spinal Level of consciousness: awake and alert and oriented Pain management: pain level controlled Vital Signs Assessment: post-procedure vital signs reviewed and stable Respiratory status: spontaneous breathing, nonlabored ventilation and respiratory function stable Cardiovascular status: blood pressure returned to baseline Postop Assessment: no apparent nausea or vomiting, spinal receding, no headache and no backache Anesthetic complications: no   No complications documented.  Last Vitals:  Vitals:   07/10/20 1915 07/10/20 1945  BP: 113/71 108/75  Pulse: 65 46  Resp: 18 18  Temp: 36.7 C 36.5 C  SpO2: 100% 100%    Last Pain:  Vitals:   07/10/20 1945  TempSrc: Oral  PainSc: 0-No pain                 Kaylyn Layer

## 2020-07-10 NOTE — Brief Op Note (Signed)
07/10/2020  6:06 PM  PATIENT:  Meghan Mills  29 y.o. female  PRE-OPERATIVE DIAGNOSIS:  IUP at term  Previous Cesarean Section SROM  POST-OPERATIVE DIAGNOSIS: Same  PROCEDURE:  Procedure(s) with comments: CESAREAN SECTION (N/A) - Repeat edc 07/24/20 NKDA  SURGEON:  Surgeon(s) and Role:    * Marcelle Overlie, MD - Primary  PHYSICIAN ASSISTANT:   ASSISTANTS: none   ANESTHESIA:   spinal  EBL:  104 mL   BLOOD ADMINISTERED:none  DRAINS: Urinary Catheter (Foley)   LOCAL MEDICATIONS USED:  LIDOCAINE   SPECIMEN: none  DISPOSITION OF SPECIMEN:  N/A  COUNTS:  YES  TOURNIQUET:  * No tourniquets in log *  DICTATION: .Other Dictation: Dictation Number dictated  PLAN OF CARE: Admit to inpatient   PATIENT DISPOSITION:  PACU - hemodynamically stable.   Delay start of Pharmacological VTE agent (>24hrs) due to surgical blood loss or risk of bleeding: not applicable

## 2020-07-10 NOTE — Anesthesia Procedure Notes (Signed)
Spinal  Patient location during procedure: OR Start time: 07/10/2020 5:23 PM End time: 07/10/2020 5:26 PM Staffing Performed: anesthesiologist  Anesthesiologist: Kaylyn Layer, MD Preanesthetic Checklist Completed: patient identified, IV checked, risks and benefits discussed, monitors and equipment checked, pre-op evaluation and timeout performed Spinal Block Patient position: sitting Prep: DuraPrep and site prepped and draped Patient monitoring: heart rate, continuous pulse ox and blood pressure Approach: midline Location: L3-4 Injection technique: single-shot Needle Needle type: Pencan  Needle gauge: 24 G Needle length: 10 cm Assessment Sensory level: T4 Additional Notes Risks, benefits, and alternative discussed. Patient gave consent to procedure. Prepped and draped in sitting position. Clear CSF obtained after one needle redirection. Positive terminal aspiration. No pain or paraesthesias with injection. Patient tolerated procedure well. Vital signs stable. Meghan Greenhouse, MD

## 2020-07-10 NOTE — Transfer of Care (Signed)
Immediate Anesthesia Transfer of Care Note  Patient: Meghan Mills  Procedure(s) Performed: CESAREAN SECTION (N/A )  Patient Location: PACU  Anesthesia Type:Spinal  Level of Consciousness: awake, alert  and oriented  Airway & Oxygen Therapy: Patient Spontanous Breathing  Post-op Assessment: Report given to RN and Post -op Vital signs reviewed and stable  Post vital signs: Reviewed and stable  Last Vitals:  Vitals Value Taken Time  BP 114/69 07/10/20 1815  Temp    Pulse 81 07/10/20 1817  Resp 21 07/10/20 1817  SpO2 100 % 07/10/20 1817  Vitals shown include unvalidated device data.  Last Pain:  Vitals:   07/10/20 1656  TempSrc: Oral  PainSc:          Complications: No complications documented.

## 2020-07-10 NOTE — MAU Note (Signed)
Dr. Vincente Poli notified of patient and patient complaints at 1435. Pt is a repeat c-section with positive amnisure in MAU.  Dr. Stephannie Peters anesthesiology notified of patient at 1438.  Gunnar Fusi OR RN notified at [phone].  Tiburcio Pea, [facility] Coverage notified of patient at [phone].  Central nursery nurse notified of patient at 69.

## 2020-07-11 ENCOUNTER — Encounter (HOSPITAL_COMMUNITY): Payer: Self-pay | Admitting: Obstetrics and Gynecology

## 2020-07-11 LAB — CBC
HCT: 30.3 % — ABNORMAL LOW (ref 36.0–46.0)
Hemoglobin: 10.1 g/dL — ABNORMAL LOW (ref 12.0–15.0)
MCH: 28.9 pg (ref 26.0–34.0)
MCHC: 33.3 g/dL (ref 30.0–36.0)
MCV: 86.6 fL (ref 80.0–100.0)
Platelets: 259 10*3/uL (ref 150–400)
RBC: 3.5 MIL/uL — ABNORMAL LOW (ref 3.87–5.11)
RDW: 13.2 % (ref 11.5–15.5)
WBC: 10.6 10*3/uL — ABNORMAL HIGH (ref 4.0–10.5)
nRBC: 0 % (ref 0.0–0.2)

## 2020-07-11 LAB — RPR: RPR Ser Ql: NONREACTIVE

## 2020-07-11 NOTE — Progress Notes (Signed)
Subjective: Postpartum Day 1: Cesarean Delivery Patient reports tolerating PO and no problems voiding.    Objective: Vital signs in last 24 hours: Temp:  [97.6 F (36.4 C)-98.7 F (37.1 C)] 98 F (36.7 C) (07/29 0820) Pulse Rate:  [46-84] 53 (07/29 0820) Resp:  [17-26] 18 (07/29 0820) BP: (103-125)/(59-81) 103/60 (07/29 0820) SpO2:  [98 %-100 %] 98 % (07/29 0820) Weight:  [103.4 kg] 103.4 kg (07/28 1300)  Physical Exam:  General: alert, cooperative, appears stated age and no distress Lochia: appropriate Uterine Fundus: firm Incision: healing well DVT Evaluation: No evidence of DVT seen on physical exam.  Recent Labs    07/10/20 1443 07/11/20 0459  HGB 11.3* 10.1*  HCT 35.1* 30.3*    Assessment/Plan: Status post Cesarean section. Doing well postoperatively.  Continue current care Circ today.  Turner Daniels 07/11/2020, 9:23 AM

## 2020-07-12 MED ORDER — OXYCODONE HCL 5 MG PO TABS: 5.0000 mg | ORAL_TABLET | ORAL | 0 refills | Status: AC | PRN

## 2020-07-12 MED ORDER — IBUPROFEN 800 MG PO TABS: 800.0000 mg | ORAL_TABLET | Freq: Three times a day (TID) | ORAL | 0 refills | Status: AC

## 2020-07-12 NOTE — Progress Notes (Signed)
Subjective: Postpartum Day 2: Cesarean Delivery Patient reports tolerating PO, + flatus and no problems voiding.    Objective: Vital signs in last 24 hours: Temp:  [97.6 F (36.4 C)-97.8 F (36.6 C)] 97.8 F (36.6 C) (07/30 0524) Pulse Rate:  [58-63] 58 (07/30 0524) Resp:  [16-18] 16 (07/30 0524) BP: (117-127)/(70-80) 122/74 (07/30 0524) SpO2:  [98 %] 98 % (07/29 2110)  Physical Exam:  General: alert, cooperative and appears stated age Lochia: appropriate Uterine Fundus: firm Incision: healing well, no significant drainage, no dehiscence DVT Evaluation: No evidence of DVT seen on physical exam. Negative Homan's sign. No cords or calf tenderness.  Recent Labs    07/10/20 1443 07/11/20 0459  HGB 11.3* 10.1*  HCT 35.1* 30.3*    Assessment/Plan: Status post Cesarean section. Doing well postoperatively.  Discharge home with standard precautions and return to clinic in 4-6 weeks.  Mitchel Honour 07/12/2020, 8:32 AM

## 2020-07-12 NOTE — Discharge Summary (Signed)
Postpartum Discharge Summary     Patient Name: Meghan Mills DOB: 1991/06/06 MRN: [id number]  Date of admission: 07/10/2020 Delivery date:07/10/2020  Delivering provider: Dian Queen  Date of discharge: 07/12/2020  Admitting diagnosis: S/P cesarean section [Z98.891] Intrauterine pregnancy: [redacted]w[redacted]d    Secondary diagnosis:  Active Problems:   S/P cesarean section  Additional problems: none    Discharge diagnosis: Term Pregnancy Delivered                                              Post partum procedures:none Augmentation: N/A Complications: None  Hospital course: Sceduled C/S   29y.o. yo G2P2002 at 384w0das admitted to the hospital 07/10/2020 for PROM with plans for scheduled repeat cesarean section with the following indication:Elective Repeat.Delivery details are as follows:  Membrane Rupture Time/Date: 7:30 AM ,07/10/2020   Delivery Method:C-Section, Vacuum Assisted  Details of operation can be found in separate operative note.  Patient had an uncomplicated postpartum course.  She is ambulating, tolerating a regular diet, passing flatus, and urinating well. Patient is discharged home in stable condition on  07/12/20        Newborn Data: Birth date:07/10/2020  Birth time:5:43 PM  Gender:Female  Living status:Living  Apgars:9 ,9  Weight:4400 g     Magnesium Sulfate received: No BMZ received: No Rhophylac:No MMR:No T-DaP:Given prenatally Flu: No Transfusion:No  Physical exam  Vitals:   07/11/20 0820 07/11/20 1510 07/11/20 2110 07/12/20 0524  BP: (!) 103/60 117/70 127/80 122/74  Pulse: 53 62 63 58  Resp: '18 18 17 16  ' Temp: 98 F (36.7 C) 97.6 F (36.4 C) 97.7 F (36.5 C) 97.8 F (36.6 C)  TempSrc: Oral Oral Oral Oral  SpO2: 98% 98% 98%   Weight:      Height:       General: alert, cooperative and no distress Lochia: appropriate Uterine Fundus: firm Incision: Healing well with no significant drainage, No significant erythema, Dressing is clean, dry, and  intact DVT Evaluation: No evidence of DVT seen on physical exam. Negative Homan's sign. No cords or calf tenderness. Labs: Lab Results  Component Value Date   WBC 10.6 (H) 07/11/2020   HGB 10.1 (L) 07/11/2020   HCT 30.3 (L) 07/11/2020   MCV 86.6 07/11/2020   PLT 259 07/11/2020   CMP Latest Ref Rng & Units 06/02/2018  Glucose 65 - 99 mg/dL 105(H)  BUN 6 - 20 mg/dL 10  Creatinine 0.44 - 1.00 mg/dL 0.97  Sodium 135 - 145 mmol/L 144  Potassium 3.5 - 5.1 mmol/L 3.7  Chloride 101 - 111 mmol/L 111  CO2 22 - 32 mmol/L 27  Calcium 8.9 - 10.3 mg/dL 8.6(L)  Total Protein 6.5 - 8.1 g/dL 5.7(L)  Total Bilirubin 0.3 - 1.2 mg/dL 2.4(H)  Alkaline Phos 38 - 126 U/L 450(H)  AST 15 - 41 U/L 251(H)  ALT 14 - 54 U/L 511(H)   EdFlavia Shippercore: Edinburgh Postnatal Depression Scale Screening Tool 04/21/2018  I have been able to laugh and see the funny side of things. 0  I have looked forward with enjoyment to things. 0  I have blamed myself unnecessarily when things went wrong. 0  I have been anxious or worried for no good reason. 0  I have felt scared or panicky for no good reason. 1  Things have been getting on top of me.  0  I have been so unhappy that I have had difficulty sleeping. 0  I have felt sad or miserable. 0  I have been so unhappy that I have been crying. 0  The thought of harming myself has occurred to me. 0  Edinburgh Postnatal Depression Scale Total 1     After visit meds:  Allergies as of 07/12/2020      Reactions   Other Hives   [employer] cough medicine       Medication List    TAKE these medications   ibuprofen 800 MG tablet Commonly known as: ADVIL Take 1 tablet (800 mg total) by mouth every 8 (eight) hours.   oxyCODONE 5 MG immediate release tablet Commonly known as: Oxy IR/ROXICODONE Take 1 tablet (5 mg total) by mouth every 4 (four) hours as needed for moderate pain.   pantoprazole 40 MG tablet Commonly known as: PROTONIX Take 40 mg by mouth daily as needed  (acid reflux).        Discharge home in stable condition Infant Feeding: Breast Infant Disposition:home with mother Discharge instruction: per After Visit Summary and Postpartum booklet. Activity: Advance as tolerated. Pelvic rest for 6 weeks.  Diet: routine diet Future Appointments:No future appointments. Follow up Visit: 6 weeks postpartum   07/12/2020 Linda Hedges, DO

## 2020-07-12 NOTE — Discharge Instructions (Signed)
Call MD for T>100.4, heavy vaginal bleeding, severe abdominal pain, or respiratory distress.  Call office to schedule postpartum visit in 6 weeks.  Pelvic rest x 6 weeks.  No driving while taking narcotics. 

## 2020-07-16 NOTE — Op Note (Signed)
NAMEVENESHA, Mills MEDICAL RECORD [id number] ACCOUNT [id number] DATE OF BIRTH:03-20-1991 FACILITY: [facility] LOCATION: [facility] PHYSICIAN:Jet Traynham Rosita Fire, MD  OPERATIVE REPORT  DATE OF PROCEDURE:  07/10/2020  PREOPERATIVE DIAGNOSES:   1.  Intrauterine pregnancy at term.  2.  Previous cesarean section and SROM .  POSTOPERATIVE DIAGNOSES:   1.  Intrauterine pregnancy at term.  2.  Previous cesarean section and SROM .  PROCEDURE:  Repeat low transverse cesarean section.  SURGEON:  Marcelle Overlie, MD  ANESTHESIA:  Spinal.  ESTIMATED BLOOD LOSS:  300 mL.  COMPLICATIONS:  None.  DRAINS:  Foley.  DESCRIPTION OF PROCEDURE:  The patient was taken to the operating room.  Her consent had been signed.  She was prepped and draped in the usual sterile fashion after spinal was inserted.  A Foley catheter had been inserted.  Timeout was performed.  A low  transverse incision was made, carried down to the fascia.  Fascia was scored in the midline and extended laterally.  The rectus muscles were separated in the midline.  The peritoneum was entered bluntly.  The peritoneal incision was then stretched.  The  lower uterine segment was identified.  The bladder flap was then created sharply and then digitally.  The bladder blade was then readjusted.  A low transverse incision was made in the uterus.  Uterus was entered using a hemostat.  The baby was in  cephalic presentation, was delivered easily with a vacuum extractor.  The cord was clamped and cut.  The baby was handed to the waiting neonatal team.  The uterus was cleared of all clots and debris after the placenta was removed.  The uterine incision  was closed in 1 layer using 0 chromic in a running locked stitch.  Irrigation was performed.  The peritoneum was closed using 0 Vicryl.  The fascia was closed using 0 Vicryl, starting in each corner and meeting in the midline.  After irrigation of  subcutaneous layer, the subcutaneous was closed with  plain gut suture interrupted.  The skin was closed with 3-0 Vicryl subcuticular.  Benzoin, Steri-Strips and a honeycomb dressing were applied.  All sponge, lap and instrument counts were correct x2.   The patient went to the recovery room in stable condition.  VN/NUANCE  D:07/16/2020 T:07/16/2020 JOB:[id number]

## 2020-07-20 ENCOUNTER — Other Ambulatory Visit (HOSPITAL_COMMUNITY)
Admission: RE | Admit: 2020-07-20 | Discharge: 2020-07-20 | Disposition: A | Discharge status: Home / Self Care | Payer: BLUE CROSS/BLUE SHIELD | Source: Ambulatory Visit | Attending: Obstetrics & Gynecology | Admitting: Obstetrics & Gynecology

## 2020-07-22 ENCOUNTER — Inpatient Hospital Stay (HOSPITAL_COMMUNITY): Admit: 2020-07-22 | Payer: BC Managed Care – PPO | Admitting: Obstetrics & Gynecology

## 2020-08-15 DIAGNOSIS — Z1389 Encounter for screening for other disorder: Secondary | ICD-10-CM | POA: Diagnosis not present

## 2020-09-09 DIAGNOSIS — Z30430 Encounter for insertion of intrauterine contraceptive device: Secondary | ICD-10-CM | POA: Diagnosis not present

## 2020-09-09 DIAGNOSIS — Z3202 Encounter for pregnancy test, result negative: Secondary | ICD-10-CM | POA: Diagnosis not present

## 2020-11-05 DIAGNOSIS — Z30431 Encounter for routine checking of intrauterine contraceptive device: Secondary | ICD-10-CM | POA: Diagnosis not present

## 2020-12-27 DIAGNOSIS — Z20822 Contact with and (suspected) exposure to covid-19: Secondary | ICD-10-CM | POA: Diagnosis not present

## 2021-01-01 DIAGNOSIS — Z20822 Contact with and (suspected) exposure to covid-19: Secondary | ICD-10-CM | POA: Diagnosis not present

## 2021-03-03 DIAGNOSIS — Z6829 Body mass index (BMI) 29.0-29.9, adult: Secondary | ICD-10-CM | POA: Diagnosis not present

## 2021-03-03 DIAGNOSIS — J329 Chronic sinusitis, unspecified: Secondary | ICD-10-CM | POA: Diagnosis not present

## 2021-03-03 DIAGNOSIS — J40 Bronchitis, not specified as acute or chronic: Secondary | ICD-10-CM | POA: Diagnosis not present

## 2021-03-03 DIAGNOSIS — Z20828 Contact with and (suspected) exposure to other viral communicable diseases: Secondary | ICD-10-CM | POA: Diagnosis not present

## 2021-08-25 DIAGNOSIS — L700 Acne vulgaris: Secondary | ICD-10-CM | POA: Diagnosis not present

## 2021-10-06 DIAGNOSIS — Z6831 Body mass index (BMI) 31.0-31.9, adult: Secondary | ICD-10-CM | POA: Diagnosis not present

## 2021-10-06 DIAGNOSIS — Z01419 Encounter for gynecological examination (general) (routine) without abnormal findings: Secondary | ICD-10-CM | POA: Diagnosis not present

## 2021-10-06 DIAGNOSIS — D649 Anemia, unspecified: Secondary | ICD-10-CM | POA: Diagnosis not present

## 2021-10-08 DIAGNOSIS — L700 Acne vulgaris: Secondary | ICD-10-CM | POA: Diagnosis not present

## 2021-12-31 DIAGNOSIS — L700 Acne vulgaris: Secondary | ICD-10-CM | POA: Diagnosis not present

## 2022-01-08 DIAGNOSIS — M545 Low back pain, unspecified: Secondary | ICD-10-CM | POA: Diagnosis not present

## 2022-03-25 DIAGNOSIS — R300 Dysuria: Secondary | ICD-10-CM | POA: Diagnosis not present

## 2022-04-29 DIAGNOSIS — L700 Acne vulgaris: Secondary | ICD-10-CM | POA: Diagnosis not present

## 2022-07-29 DIAGNOSIS — L700 Acne vulgaris: Secondary | ICD-10-CM | POA: Diagnosis not present

## 2022-08-04 DIAGNOSIS — Z30432 Encounter for removal of intrauterine contraceptive device: Secondary | ICD-10-CM | POA: Diagnosis not present

## 2022-08-04 DIAGNOSIS — Z309 Encounter for contraceptive management, unspecified: Secondary | ICD-10-CM | POA: Diagnosis not present

## 2022-08-21 DIAGNOSIS — R051 Acute cough: Secondary | ICD-10-CM | POA: Diagnosis not present

## 2022-08-21 DIAGNOSIS — J069 Acute upper respiratory infection, unspecified: Secondary | ICD-10-CM | POA: Diagnosis not present

## 2022-10-12 DIAGNOSIS — Z6830 Body mass index (BMI) 30.0-30.9, adult: Secondary | ICD-10-CM | POA: Diagnosis not present

## 2022-10-12 DIAGNOSIS — Z01419 Encounter for gynecological examination (general) (routine) without abnormal findings: Secondary | ICD-10-CM | POA: Diagnosis not present

## 2022-10-12 DIAGNOSIS — Z1151 Encounter for screening for human papillomavirus (HPV): Secondary | ICD-10-CM | POA: Diagnosis not present

## 2022-10-12 DIAGNOSIS — Z124 Encounter for screening for malignant neoplasm of cervix: Secondary | ICD-10-CM | POA: Diagnosis not present

## 2022-11-16 DIAGNOSIS — R509 Fever, unspecified: Secondary | ICD-10-CM | POA: Diagnosis not present

## 2022-12-08 DIAGNOSIS — Z319 Encounter for procreative management, unspecified: Secondary | ICD-10-CM | POA: Diagnosis not present

## 2022-12-08 DIAGNOSIS — N911 Secondary amenorrhea: Secondary | ICD-10-CM | POA: Diagnosis not present

## 2022-12-16 DIAGNOSIS — Z319 Encounter for procreative management, unspecified: Secondary | ICD-10-CM | POA: Diagnosis not present

## 2022-12-19 DIAGNOSIS — M5431 Sciatica, right side: Secondary | ICD-10-CM | POA: Diagnosis not present

## 2022-12-23 DIAGNOSIS — N979 Female infertility, unspecified: Secondary | ICD-10-CM | POA: Diagnosis not present

## 2022-12-28 DIAGNOSIS — J00 Acute nasopharyngitis [common cold]: Secondary | ICD-10-CM | POA: Diagnosis not present

## 2022-12-30 DIAGNOSIS — R0981 Nasal congestion: Secondary | ICD-10-CM | POA: Diagnosis not present

## 2022-12-30 DIAGNOSIS — J0100 Acute maxillary sinusitis, unspecified: Secondary | ICD-10-CM | POA: Diagnosis not present

## 2023-01-06 DIAGNOSIS — N979 Female infertility, unspecified: Secondary | ICD-10-CM | POA: Diagnosis not present

## 2023-02-02 DIAGNOSIS — Z3141 Encounter for fertility testing: Secondary | ICD-10-CM | POA: Diagnosis not present

## 2023-03-04 DIAGNOSIS — R1032 Left lower quadrant pain: Secondary | ICD-10-CM | POA: Diagnosis not present

## 2023-03-04 DIAGNOSIS — S2341XA Sprain of ribs, initial encounter: Secondary | ICD-10-CM | POA: Diagnosis not present

## 2023-08-09 DIAGNOSIS — Z30430 Encounter for insertion of intrauterine contraceptive device: Secondary | ICD-10-CM | POA: Diagnosis not present

## 2023-08-09 DIAGNOSIS — Z3202 Encounter for pregnancy test, result negative: Secondary | ICD-10-CM | POA: Diagnosis not present

## 2023-09-01 DIAGNOSIS — B349 Viral infection, unspecified: Secondary | ICD-10-CM | POA: Diagnosis not present

## 2023-09-01 DIAGNOSIS — Z20822 Contact with and (suspected) exposure to covid-19: Secondary | ICD-10-CM | POA: Diagnosis not present

## 2023-09-22 DIAGNOSIS — Z30431 Encounter for routine checking of intrauterine contraceptive device: Secondary | ICD-10-CM | POA: Diagnosis not present

## 2023-09-27 DIAGNOSIS — M5430 Sciatica, unspecified side: Secondary | ICD-10-CM | POA: Diagnosis not present

## 2023-11-01 DIAGNOSIS — Z1329 Encounter for screening for other suspected endocrine disorder: Secondary | ICD-10-CM | POA: Diagnosis not present

## 2023-11-01 DIAGNOSIS — Z6831 Body mass index (BMI) 31.0-31.9, adult: Secondary | ICD-10-CM | POA: Diagnosis not present

## 2023-11-01 DIAGNOSIS — Z13228 Encounter for screening for other metabolic disorders: Secondary | ICD-10-CM | POA: Diagnosis not present

## 2023-11-01 DIAGNOSIS — Z01419 Encounter for gynecological examination (general) (routine) without abnormal findings: Secondary | ICD-10-CM | POA: Diagnosis not present

## 2023-11-01 DIAGNOSIS — Z13220 Encounter for screening for lipoid disorders: Secondary | ICD-10-CM | POA: Diagnosis not present

## 2023-11-01 DIAGNOSIS — Z124 Encounter for screening for malignant neoplasm of cervix: Secondary | ICD-10-CM | POA: Diagnosis not present

## 2023-11-01 DIAGNOSIS — Z1151 Encounter for screening for human papillomavirus (HPV): Secondary | ICD-10-CM | POA: Diagnosis not present

## 2023-11-01 DIAGNOSIS — Z131 Encounter for screening for diabetes mellitus: Secondary | ICD-10-CM | POA: Diagnosis not present

## 2023-11-16 DIAGNOSIS — M545 Low back pain, unspecified: Secondary | ICD-10-CM | POA: Diagnosis not present

## 2023-12-06 DIAGNOSIS — R7989 Other specified abnormal findings of blood chemistry: Secondary | ICD-10-CM | POA: Diagnosis not present
